# Patient Record
Sex: Male | Born: 1965 | Race: White | Hispanic: No | Smoking: Former smoker
Health system: Southern US, Community
[De-identification: ages and names within clinical notes are randomized; demographics above are authoritative.]

## PROBLEM LIST (undated history)

## (undated) DIAGNOSIS — I10 Essential (primary) hypertension: Secondary | ICD-10-CM

## (undated) DIAGNOSIS — E785 Hyperlipidemia, unspecified: Secondary | ICD-10-CM

## (undated) DIAGNOSIS — G4733 Obstructive sleep apnea (adult) (pediatric): Secondary | ICD-10-CM

## (undated) HISTORY — DX: Essential (primary) hypertension: I10

## (undated) HISTORY — DX: Obstructive sleep apnea (adult) (pediatric): G47.33

## (undated) HISTORY — DX: Hyperlipidemia, unspecified: E78.5

---

## 2007-08-03 HISTORY — PX: CARDIAC CATHETERIZATION: SHX172

## 2012-11-22 ENCOUNTER — Other Ambulatory Visit (HOSPITAL_COMMUNITY): Payer: Self-pay | Admitting: Cardiovascular Disease

## 2012-11-22 DIAGNOSIS — R079 Chest pain, unspecified: Secondary | ICD-10-CM

## 2012-11-22 DIAGNOSIS — R011 Cardiac murmur, unspecified: Secondary | ICD-10-CM

## 2012-11-28 ENCOUNTER — Ambulatory Visit (HOSPITAL_COMMUNITY)
Admission: RE | Admit: 2012-11-28 | Discharge: 2012-11-28 | Disposition: A | Discharge status: Home / Self Care | Payer: BC Managed Care – PPO | Source: Ambulatory Visit | Attending: Cardiovascular Disease | Admitting: Cardiovascular Disease

## 2012-11-28 ENCOUNTER — Other Ambulatory Visit (HOSPITAL_COMMUNITY): Payer: Self-pay | Admitting: Cardiovascular Disease

## 2012-11-28 DIAGNOSIS — R0989 Other specified symptoms and signs involving the circulatory and respiratory systems: Secondary | ICD-10-CM | POA: Insufficient documentation

## 2012-11-28 DIAGNOSIS — R011 Cardiac murmur, unspecified: Secondary | ICD-10-CM

## 2012-11-28 DIAGNOSIS — I10 Essential (primary) hypertension: Secondary | ICD-10-CM | POA: Insufficient documentation

## 2012-11-28 DIAGNOSIS — R079 Chest pain, unspecified: Secondary | ICD-10-CM | POA: Insufficient documentation

## 2012-11-28 DIAGNOSIS — R42 Dizziness and giddiness: Secondary | ICD-10-CM | POA: Insufficient documentation

## 2012-11-28 DIAGNOSIS — I519 Heart disease, unspecified: Secondary | ICD-10-CM

## 2012-11-28 DIAGNOSIS — R0609 Other forms of dyspnea: Secondary | ICD-10-CM | POA: Insufficient documentation

## 2012-11-28 MED ORDER — TECHNETIUM TC 99M SESTAMIBI GENERIC - CARDIOLITE
10.2000 | Freq: Once | INTRAVENOUS | Status: AC | PRN
  Administered 2012-11-28: 10 via INTRAVENOUS

## 2012-11-28 MED ORDER — TECHNETIUM TC 99M SESTAMIBI GENERIC - CARDIOLITE
30.1000 | Freq: Once | INTRAVENOUS | Status: AC | PRN
  Administered 2012-11-28: 30.1 via INTRAVENOUS

## 2012-11-28 NOTE — Procedures (Addendum)
[facility] [address] [place] [address] [phone]  Cardiology Nuclear Med Study  Dennis Bridges is a 47 y.o. male     MRN : [id number]     DOB: [birth year]  Procedure Date: 11/28/2012  Nuclear Med Background Indication for Stress Test:  Evaluation for Ischemia History:  IRREGULAR HEART RATE Cardiac Risk Factors: Family History - CAD, History of Smoking, Hypertension, Lipids and Obesity  Symptoms:  Chest Pain, Dizziness, DOE and Light-Headedness   Nuclear Pre-Procedure Caffeine/Decaff Intake:  5:00am NPO After: 7:00pm   IV Site: R Hand  IV 0.9% NS with Angio Cath:  22g  Chest Size (in):  52"  IV Started by: Emmit Pomfret, RN  Height: 5' 10.5" (1.791 m)  Cup Size: n/a  BMI:  Body mass index is 41.15 kg/(m^2). Weight:  291 lb (131.997 kg)   Tech Comments:  N/A    Nuclear Med Study 1 or 2 day study: 1 day  Stress Test Type:  Stress  Order Authorizing Provider:  Susa Griffins, MD   Resting Radionuclide: Technetium 23m Sestamibi  Resting Radionuclide Dose: 10.2 mCi   Stress Radionuclide:  Technetium 30m Sestamibi  Stress Radionuclide Dose: 30.1 mCi           Stress Protocol Rest HR: 69 Stress HR: 166  Rest BP: 150/71 Stress BP: 209/91  Exercise Time (min): 6:30 METS: 7.7   Predicted Max HR: 172 bpm % Max HR: 96.51 bpm Rate Pressure Product: 84132  Dose of Adenosine (mg):  n/a Dose of Lexiscan: n/a mg  Dose of Atropine (mg): n/a Dose of Dobutamine: n/a mcg/kg/min (at max HR)  Stress Test Technologist: Esperanza Sheets, CCT Nuclear Technologist: Gonzella Lex, CNMT   Rest Procedure:  Myocardial perfusion imaging was performed at rest 45 minutes following the intravenous administration of Technetium 62m Sestamibi. Stress Procedure:  The patient performed treadmill exercise using a Bruce  Protocol for 6:30 minutes. The patient stopped due to SOB and achieved target heart rate and denied any chest pain.  There  were no significant ST-T wave changes.  Technetium 75m Sestamibi was injected at peak exercise and myocardial perfusion imaging was performed after a brief delay.  Transient Ischemic Dilatation (Normal <1.22):  0.97 Lung/Heart Ratio (Normal <0.45):  0.32 QGS EDV:  95 ml QGS ESV:  32 ml LV Ejection Fraction: 63%  Signed by    Rest ECG: NSR - Normal EKG  Stress ECG: No significant change from baseline ECG  QPS Raw Data Images:  Normal; no motion artifact; normal heart/lung ratio. Stress Images:  Very mild diaphragmatic attenuation; otherwise,normal homogeneous uptake in all areas of the myocardium. Rest Images:  Normal homogeneous uptake in all areas of the myocardium. Subtraction (SDS):  Normal  Impression Exercise Capacity:  Good exercise capacity. BP Response:  Hypertensive blood pressure response. Clinical Symptoms:  No significant symptoms noted. ECG Impression:  No significant ST segment change suggestive of ischemia. Comparison with Prior Nuclear Study: No previous nuclear study performed  Overall Impression:  Low risk stress nuclear study demonstrating minimal diaphragmatic attenuation and hypertensive response to exercise.  LV Wall Motion:  NL LV Function; NL Wall Motion   Lennette Bihari, MD  11/28/2012 2:47 PM

## 2012-12-04 ENCOUNTER — Ambulatory Visit (HOSPITAL_COMMUNITY): Payer: BC Managed Care – PPO

## 2012-12-05 ENCOUNTER — Ambulatory Visit (HOSPITAL_COMMUNITY)
Admission: RE | Admit: 2012-12-05 | Discharge: 2012-12-05 | Disposition: A | Discharge status: Home / Self Care | Payer: BC Managed Care – PPO | Source: Ambulatory Visit | Attending: Cardiovascular Disease | Admitting: Cardiovascular Disease

## 2012-12-05 DIAGNOSIS — I10 Essential (primary) hypertension: Secondary | ICD-10-CM | POA: Insufficient documentation

## 2012-12-05 DIAGNOSIS — R011 Cardiac murmur, unspecified: Secondary | ICD-10-CM

## 2012-12-05 DIAGNOSIS — I519 Heart disease, unspecified: Secondary | ICD-10-CM

## 2012-12-05 DIAGNOSIS — F17200 Nicotine dependence, unspecified, uncomplicated: Secondary | ICD-10-CM | POA: Insufficient documentation

## 2012-12-05 NOTE — Progress Notes (Signed)
2D Echo Performed 12/05/2012    Hensley Treat, RCS  

## 2013-03-19 ENCOUNTER — Other Ambulatory Visit: Payer: Self-pay | Admitting: Cardiovascular Disease

## 2013-03-20 LAB — COMPREHENSIVE METABOLIC PANEL
AST: 19 U/L (ref 0–37)
Alkaline Phosphatase: 41 U/L (ref 39–117)
BUN: 17 mg/dL (ref 6–23)
Calcium: 10.4 mg/dL (ref 8.4–10.5)
Chloride: 101 mEq/L (ref 96–112)
Creat: 1.19 mg/dL (ref 0.50–1.35)
Total Bilirubin: 0.6 mg/dL (ref 0.3–1.2)

## 2013-03-20 LAB — CBC WITH DIFFERENTIAL/PLATELET
Basophils Absolute: 0.1 10*3/uL (ref 0.0–0.1)
Basophils Relative: 1 % (ref 0–1)
Eosinophils Absolute: 0.3 10*3/uL (ref 0.0–0.7)
Eosinophils Relative: 3 % (ref 0–5)
HCT: 43.4 % (ref 39.0–52.0)
MCH: 28.1 pg (ref 26.0–34.0)
MCHC: 33.2 g/dL (ref 30.0–36.0)
MCV: 84.8 fL (ref 78.0–100.0)
Monocytes Absolute: 0.4 10*3/uL (ref 0.1–1.0)
Platelets: 318 10*3/uL (ref 150–400)
RDW: 14.4 % (ref 11.5–15.5)

## 2013-03-28 ENCOUNTER — Encounter: Payer: Self-pay | Admitting: Cardiovascular Disease

## 2013-03-29 NOTE — ED Provider Notes (Signed)
Order(s) created erroneously. Erroneous order ID: [id number] Order canceled by: Archie Endo B Order cancel date/time: 03/29/2013 11:47 AM

## 2013-07-17 ENCOUNTER — Ambulatory Visit: Payer: BC Managed Care – PPO | Admitting: Cardiovascular Disease

## 2013-07-20 ENCOUNTER — Telehealth: Payer: Self-pay | Admitting: Cardiovascular Disease

## 2013-07-20 ENCOUNTER — Telehealth: Payer: Self-pay | Admitting: *Deleted

## 2013-07-20 NOTE — Telephone Encounter (Signed)
Mr.Mauzy is a former Dr.Weintraub patient and has not been assigned a new provider as of yet. But he needs a letter faxed to him from his cardiologist stating that it is ok for him to drive . He has [place] license and the state is requesting the letter he needs to have this letter within 15 days so that he may continue to work .. .. Please faxed to [phone].Marland Kitchen Please call if you have any questions..    Thanks

## 2013-07-20 NOTE — Telephone Encounter (Signed)
This pt needs an appointment for evaluation before MD can write a letter with this type of statement.  Please schedule first available (30 min) appt with a new cardiologist.  Thanks. ~ [facility]

## 2013-07-20 NOTE — Telephone Encounter (Signed)
Spoke with patient. Dennis Bridges states he need cardiac clearance for DOT -physician at [facility]. He states he has the letter that explains what is needed.   RN informed patient that an appointment can be made with an EXTENDER before 08/09/13.An possible get the ball rolling on his situation Spoke with Corine Shelter PA- and patient will be schedule to see him next week.  Patient is aware to bring medication , letter, and insurance card.

## 2013-07-24 ENCOUNTER — Ambulatory Visit (INDEPENDENT_AMBULATORY_CARE_PROVIDER_SITE_OTHER): Payer: BC Managed Care – PPO | Admitting: Cardiology

## 2013-07-24 ENCOUNTER — Encounter: Payer: Self-pay | Admitting: Cardiology

## 2013-07-24 VITALS — BP 142/88 | HR 62 | Ht 70.0 in | Wt 229.0 lb

## 2013-07-24 DIAGNOSIS — E669 Obesity, unspecified: Secondary | ICD-10-CM | POA: Insufficient documentation

## 2013-07-24 DIAGNOSIS — I10 Essential (primary) hypertension: Secondary | ICD-10-CM

## 2013-07-24 DIAGNOSIS — G4730 Sleep apnea, unspecified: Secondary | ICD-10-CM

## 2013-07-24 NOTE — Progress Notes (Signed)
07/24/2013 Dennis Bridges   July 26, 1966  [id number]  Primary Physicia Dennis Bridges., MD Primary Cardiologist: Dr Tresa Endo (former [facility] pt)  HPI:  Pleasant 47 y/o followed in the pst by Dr Alanda Amass with HTN, sleep apnea, and obesity. He had a positive sleep study and has been compliant with nightly C-pap. He has a negative Myoview and a normal echo in May 2014. He has manged to loose 70 lbs since last August with diet and exercise. He says he does 45 minute on a treadmill daily. He has no history of CAD, MI, chest pain, arrythmia, syncope, or palpitations. He is here for cardiac clearance for DOT.   Current Outpatient Prescriptions  Medication Sig Dispense Refill  . amLODipine (NORVASC) 5 MG tablet Take 5 mg by mouth daily.      Marland Kitchen aspirin 81 MG tablet Take 81 mg by mouth daily.      Marland Kitchen buPROPion (WELLBUTRIN SR) 150 MG 12 hr tablet Take 150 mg by mouth daily.      . fenofibrate (TRICOR) 145 MG tablet Take 145 mg by mouth daily.      . nebivolol (BYSTOLIC) 5 MG tablet Take 5 mg by mouth daily. qod      . Omega-3 Fatty Acids (FISH OIL) 1200 MG CPDR Take 1,200 Units by mouth 2 (two) times daily.      . pantoprazole (PROTONIX) 40 MG tablet Take 40 mg by mouth 2 (two) times daily.       No current facility-administered medications for this visit.    No Known Allergies  History   Social History  . Marital Status: Married    Spouse Name: N/A    Number of Children: N/A  . Years of Education: N/A   Occupational History  . Not on file.   Social History Main Topics  . Smoking status: Not on file  . Smokeless tobacco: Not on file  . Alcohol Use: Not on file  . Drug Use: Not on file  . Sexual Activity: Not on file   Other Topics Concern  . Not on file   Social History Narrative  . No narrative on file     Review of Systems: General: negative for chills, fever, night sweats or weight changes.  Cardiovascular: negative for chest pain, dyspnea on exertion, edema, orthopnea, palpitations,  paroxysmal nocturnal dyspnea or shortness of breath Dermatological: negative for rash Respiratory: negative for cough or wheezing Urologic: negative for hematuria Abdominal: negative for nausea, vomiting, diarrhea, bright red blood per rectum, melena, or hematemesis Neurologic: negative for visual changes, syncope, or dizziness All other systems reviewed and are otherwise negative except as noted above.    Blood pressure 142/88, pulse 62, height 5\' 10"  (1.778 m), weight 229 lb (103.874 kg).  General appearance: alert, cooperative and no distress Neck: no carotid bruit and no JVD Lungs: clear to auscultation bilaterally Heart: regular rate and rhythm Extremities: no edema  EKG NSR without acute changes  ASSESSMENT AND PLAN:   HTN (hypertension) Controlled  Sleep apnea- complaint with C-pap .   PLAN  No cardiac restrictions, cleared from our standpoint for DOT PE. He asked about another sleep study since he has lost weight. I think that would be reasonable. His goal weight would be 190.   Dennis Bridges KPA-C 07/24/2013 2:13 PM

## 2013-07-24 NOTE — Assessment & Plan Note (Signed)
Controlled.  

## 2013-07-25 ENCOUNTER — Encounter: Payer: Self-pay | Admitting: Cardiology

## 2013-08-29 ENCOUNTER — Other Ambulatory Visit: Payer: Self-pay | Admitting: *Deleted

## 2013-08-29 MED ORDER — NEBIVOLOL HCL 5 MG PO TABS: 5.0000 mg | ORAL_TABLET | Freq: Every day | ORAL | Status: DC

## 2014-02-07 ENCOUNTER — Telehealth: Payer: Self-pay | Admitting: *Deleted

## 2014-02-07 NOTE — Telephone Encounter (Signed)
[facility]

## 2014-05-01 ENCOUNTER — Ambulatory Visit: Payer: BC Managed Care – PPO | Admitting: Cardiovascular Disease

## 2014-05-06 ENCOUNTER — Other Ambulatory Visit (HOSPITAL_COMMUNITY): Payer: Self-pay | Admitting: Cardiovascular Disease

## 2014-05-06 DIAGNOSIS — I701 Atherosclerosis of renal artery: Secondary | ICD-10-CM

## 2014-05-08 ENCOUNTER — Ambulatory Visit (HOSPITAL_COMMUNITY)
Admission: RE | Admit: 2014-05-08 | Discharge: 2014-05-08 | Disposition: A | Discharge status: Home / Self Care | Payer: BC Managed Care – PPO | Source: Ambulatory Visit | Attending: Cardiovascular Disease | Admitting: Cardiovascular Disease

## 2014-05-08 ENCOUNTER — Ambulatory Visit (INDEPENDENT_AMBULATORY_CARE_PROVIDER_SITE_OTHER): Payer: BC Managed Care – PPO | Admitting: Cardiovascular Disease

## 2014-05-08 ENCOUNTER — Encounter: Payer: Self-pay | Admitting: Cardiovascular Disease

## 2014-05-08 VITALS — BP 144/78 | HR 59 | Ht 70.0 in | Wt 234.6 lb

## 2014-05-08 DIAGNOSIS — I701 Atherosclerosis of renal artery: Secondary | ICD-10-CM | POA: Insufficient documentation

## 2014-05-08 DIAGNOSIS — E669 Obesity, unspecified: Secondary | ICD-10-CM

## 2014-05-08 DIAGNOSIS — G4733 Obstructive sleep apnea (adult) (pediatric): Secondary | ICD-10-CM

## 2014-05-08 DIAGNOSIS — I10 Essential (primary) hypertension: Secondary | ICD-10-CM

## 2014-05-08 DIAGNOSIS — Z9989 Dependence on other enabling machines and devices: Secondary | ICD-10-CM

## 2014-05-08 MED ORDER — HYDRALAZINE HCL 25 MG PO TABS: ORAL_TABLET | ORAL | Status: DC

## 2014-05-08 NOTE — Progress Notes (Signed)
Renal Artery Duplex Completed. °Dennis Bridges,RVT °

## 2014-05-08 NOTE — Patient Instructions (Signed)
Your physician has recommended you make the following change in your medication: the hydralazine has been changed to 37.5 mg twice daily.  ( 25 mg = 1 & 1/2 tablet)  Your physician wants you to follow-up in: 6 months or sooner if needed. You will receive a reminder letter in the mail two months in advance. If you don't receive a letter, please call our office to schedule the follow-up appointment.

## 2014-05-08 NOTE — Progress Notes (Signed)
Patient ID: Dennis Amberichard Dinh, male   DOB: 04/13/1966, 48 y.o.   MRN: [id number]     HPI: Dennis AmberRichard Bridges is a 48 y.o. male who presents to the office today to establish cardiology care with me.  He is a former patient of Dr. Alanda AmassWeintraub and last saw Dr. Alanda AmassWeintraub in August 2014.  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  He was followed by Dr. Alanda AmassWeintraub for obesity, hyperlipidemia, and in the past, had a negative workup for pheochromocytoma.  An echo Doppler study in May 2014 showed an ejection fraction of 55-60%.  He had normal diastolic parameters.  His left atrium was mildly dilated.  A nuclear perfusion study in April 2014 revealed normal perfusion and function with post stress ejection fraction at 63%.  He saw Corine ShelterLuke Kilroy, [facility] in December 2014 4 cardiac clearance for his DOT physical..  He also has a history of obstructive sleep apnea and utilizes nightly CPAP.  He states that he has lost over 100 pounds and had reached a maximum weight of 314 and a minimum weight of approximately 215.  He does have a history of mild hypertension and has been taking hydralazine 50 mg once a day.  In the past he had been on additional medications, but these have been discontinued by his primary physician, Dr. Nadyne CoombesKaren Richter with his  significant weight loss.  He underwent a renal ultrasound today to further evaluate previously noted mild renal artery stenosis.   He denies chest pain.  He denies palpitations.  He presents to the office to establish care with me today.   Past Medical History  Diagnosis Date  . Hyperlipidemia   . Hypertension     History reviewed. No pertinent past surgical history.  No Known Allergies  Current Outpatient Prescriptions  Medication Sig Dispense Refill  . aspirin 81 MG tablet Take 81 mg by mouth daily.      . fenofibrate (TRICOR) 145 MG tablet Take 145 mg by mouth daily.      . Omega-3 Fatty Acids (FISH  OIL) 1200 MG CPDR Take 1,200 Units by mouth 2 (two) times daily.      . hydrALAZINE (APRESOLINE) 25 MG tablet Take 1 & 1/2 tablet twice a day  90 tablet  6   No current facility-administered medications for this visit.    History   Social History  . Marital Status: Married    Spouse Name: N/A    Number of Children: N/A  . Years of Education: N/A   Occupational History  . Not on file.   Social History Main Topics  . Smoking status: Former Smoker -- 1.00 packs/day for 20 years    Types: Cigarettes    Quit date: 07/24/2010  . Smokeless tobacco: Never Used  . Alcohol Use: Not on file  . Drug Use: Not on file  . Sexual Activity: Not on file   Other Topics Concern  . Not on file   Social History Narrative  . No narrative on file   Social history is notable in that he is originally from [place] and lived in to refill.  He is currently undergoing a divorce.  He has 2 children, ages 4619, and 7521.  There is a remote tobacco history.  Family History  Problem Relation Age of Onset  . Osteoporosis Mother   . Cancer - Lung Father   . Cancer - Other Father  brain  . Diabetes Father   . Hypertension Father     ROS General: Negative; No fevers, chills, or night sweats; positive for purposeful weight loss HEENT: Negative; No changes in vision or hearing, sinus congestion, difficulty swallowing Pulmonary: Negative; No cough, wheezing, shortness of breath, hemoptysis Cardiovascular: See HPI: No chest pain, presyncope, syncope, palpatations GI: Negative; No nausea, vomiting, diarrhea, or abdominal pain GU: Positive for mild renal artery narrowing on his last Doppler in October 2013 with velocities consistent with the 1-59% diameter reduction bilaterally No dysuria, hematuria, or difficulty voiding Musculoskeletal: Negative; no myalgias, joint pain, or weakness Hematologic: Negative; no easy bruising, bleeding Endocrine: Negative; no heat/cold intolerance; no diabetes, Neuro:  Negative; no changes in balance, headaches Skin: Negative; No rashes or skin lesions Psychiatric: Negative; No behavioral problems, depression Sleep: Positive for obstructive sleep apnea,  No daytime sleepiness, hypersomnolence, bruxism, restless legs, hypnogognic hallucinations. Other comprehensive 14 point system review is negative   Physical Exam BP 144/78  Pulse 59  Ht 5\' 10"  (1.778 m)  Wt 234 lb 9.6 oz (106.414 kg)  BMI 33.66 kg/m2 General: Alert, oriented, no distress.  Skin: normal turgor, no rashes, warm and dry HEENT: Normocephalic, atraumatic. Pupils equal round and reactive to light; sclera anicteric; extraocular muscles intact, No lid lag; Nose without nasal septal hypertrophy; Mouth/Parynx benign; Mallinpatti scale 3 Neck: No JVD, no carotid bruits; normal carotid upstroke Lungs: clear to ausculatation and percussion bilaterally; no wheezing or rales, normal inspiratory and expiratory effort Chest wall: without tenderness to palpitation Heart: PMI not displaced, RRR, s1 s2 normal, 1/6 systolic murmur, No diastolic murmur, no rubs, gallops, thrills, or heaves Abdomen: soft, nontender; no hepatosplenomehaly, BS+; abdominal aorta nontender and not dilated by palpation. Back: no CVA tenderness Pulses: 2+  Musculoskeletal: full range of motion, normal strength, no joint deformities Extremities: Pulses 2+, no clubbing cyanosis or edema, Homan's sign negative  Neurologic: grossly nonfocal; Cranial nerves grossly wnl Psychologic: Normal mood and affect   ECG (independently read by me): Sinus bradycardia 59 beats per minute.  No ectopy.  Normal intervals.  LABS:  BMET    Component Value Date/Time   NA 135 03/19/2013 1519   K 4.3 03/19/2013 1519   CL 101 03/19/2013 1519   CO2 27 03/19/2013 1519   GLUCOSE 93 03/19/2013 1519   BUN 17 03/19/2013 1519   CREATININE 1.19 03/19/2013 1519   CALCIUM 10.4 03/19/2013 1519     Hepatic Function Panel     Component Value Date/Time    PROT 7.6 03/19/2013 1519   ALBUMIN 4.9 03/19/2013 1519   AST 19 03/19/2013 1519   ALT 30 03/19/2013 1519   ALKPHOS 41 03/19/2013 1519   BILITOT 0.6 03/19/2013 1519     CBC    Component Value Date/Time   WBC 9.0 03/19/2013 1519   RBC 5.12 03/19/2013 1519   HGB 14.4 03/19/2013 1519   HCT 43.4 03/19/2013 1519   PLT 318 03/19/2013 1519   MCV 84.8 03/19/2013 1519   MCH 28.1 03/19/2013 1519   MCHC 33.2 03/19/2013 1519   RDW 14.4 03/19/2013 1519   LYMPHSABS 2.4 03/19/2013 1519   MONOABS 0.4 03/19/2013 1519   EOSABS 0.3 03/19/2013 1519   BASOSABS 0.1 03/19/2013 1519     BNP No results found for this basename: probnp    Lipid Panel  No results found for this basename: chol, trig, hdl, cholhdl, vldl, ldlcalc, ldldirect     RADIOLOGY: No results found.    ASSESSMENT AND PLAN: Mr.  Dennis Bridges is a 48 year old gentleman with a prior history of morbid obesity, who has lost approximately 80 pounds from his maximal weight.  He has a history of hypertension and has had many of his medications discontinued with his weight loss.  He does have a history of hyperlipidemia and has been on fenofibrate and fish oil.  He tells me he recently he had laboratory checked by his primary physician and his cholesterol was 131.  I will try to obtain these results.  Remotely, he had been on lisinopril and development issues secondary to this. His blood pressure today was mildly elevated at 144/78 and apparently has only been taking hydralazine 50 mg once a day.  I have recommended that we change his prescription from 50 mg pill to a 25 mg pill and suggested he take 37.5 mg twice a day for more optimal blood pressure control and greater likelihood for steady state blood levels.  I will review his renal Doppler study from today.  Once this has been interpreted to make certain he is not develop progressive renal artery stenosis.  His body mass index is still elevated at 33.66 and is consistent with mild obesity.  Further,  weight loss, and exercise was recommended.  He will monitor his blood pressure and contact me if this is not improved.  As long as he remains stable, I will see him in 6 months for cardiology reevaluation or sooner if problems arise.     Lennette Bihari, MD, [facility]  05/09/2014 5:30 PM

## 2014-05-09 ENCOUNTER — Telehealth: Payer: Self-pay | Admitting: *Deleted

## 2014-05-09 DIAGNOSIS — Z9989 Dependence on other enabling machines and devices: Secondary | ICD-10-CM

## 2014-05-09 DIAGNOSIS — G4733 Obstructive sleep apnea (adult) (pediatric): Secondary | ICD-10-CM | POA: Insufficient documentation

## 2014-05-09 DIAGNOSIS — I701 Atherosclerosis of renal artery: Secondary | ICD-10-CM | POA: Insufficient documentation

## 2014-05-09 NOTE — Telephone Encounter (Signed)
Left message renal ultrasound no significant change from previous study. Call if questions or concerns.

## 2014-05-09 NOTE — Telephone Encounter (Signed)
Message copied by Gaynelle CageWADDELL, Jahdai Padovano M. on Thu May 09, 2014  2:54 PM ------      Message from: Nicki GuadalajaraKELLY, THOMAS A      Created: Thu May 09, 2014  8:25 AM       Mild bilateral abnl; no sig change from previous ------

## 2014-05-13 ENCOUNTER — Encounter (HOSPITAL_COMMUNITY): Payer: BC Managed Care – PPO

## 2014-06-10 ENCOUNTER — Other Ambulatory Visit: Payer: Self-pay | Admitting: Cardiovascular Disease

## 2014-06-10 MED ORDER — FENOFIBRATE 145 MG PO TABS: 145.0000 mg | ORAL_TABLET | Freq: Every day | ORAL | Status: AC

## 2014-06-10 NOTE — Telephone Encounter (Signed)
Pt called in stating that he does not have any more refills of his Tricore and he was just in to see Dr. Tresa EndoKelly on 10/7. [facility] [address]  Thanks

## 2014-06-10 NOTE — Telephone Encounter (Signed)
Rx refill sent to patient pharmacy   

## 2014-09-05 ENCOUNTER — Telehealth: Payer: Self-pay | Admitting: Cardiovascular Disease

## 2014-09-05 NOTE — Telephone Encounter (Signed)
Close encounter 

## 2014-09-24 ENCOUNTER — Emergency Department (HOSPITAL_BASED_OUTPATIENT_CLINIC_OR_DEPARTMENT_OTHER)
Admission: EM | Admit: 2014-09-24 | Discharge: 2014-09-24 | Disposition: A | Discharge status: Home / Self Care | Payer: BLUE CROSS/BLUE SHIELD | Attending: Emergency Medicine | Admitting: Emergency Medicine

## 2014-09-24 ENCOUNTER — Encounter (HOSPITAL_BASED_OUTPATIENT_CLINIC_OR_DEPARTMENT_OTHER): Payer: Self-pay

## 2014-09-24 DIAGNOSIS — E785 Hyperlipidemia, unspecified: Secondary | ICD-10-CM | POA: Insufficient documentation

## 2014-09-24 DIAGNOSIS — G5611 Other lesions of median nerve, right upper limb: Secondary | ICD-10-CM

## 2014-09-24 DIAGNOSIS — Z7982 Long term (current) use of aspirin: Secondary | ICD-10-CM | POA: Insufficient documentation

## 2014-09-24 DIAGNOSIS — I10 Essential (primary) hypertension: Secondary | ICD-10-CM | POA: Diagnosis not present

## 2014-09-24 DIAGNOSIS — R200 Anesthesia of skin: Secondary | ICD-10-CM | POA: Diagnosis present

## 2014-09-24 DIAGNOSIS — Z87891 Personal history of nicotine dependence: Secondary | ICD-10-CM | POA: Diagnosis not present

## 2014-09-24 DIAGNOSIS — G5601 Carpal tunnel syndrome, right upper limb: Secondary | ICD-10-CM | POA: Diagnosis not present

## 2014-09-24 MED ORDER — NAPROXEN 500 MG PO TABS: 500.0000 mg | ORAL_TABLET | Freq: Two times a day (BID) | ORAL | Status: DC

## 2014-09-24 NOTE — Discharge Instructions (Signed)
Wear the splint, particularly at night. Recheck with Dr. Amanda PeaGramig if not improving within the next 1-2 weeks  Carpal Tunnel Syndrome The carpal tunnel is an area under the skin of the palm of your hand. Nerves, blood vessels, and strong tissues (tendons) pass through the tunnel. The tunnel can become puffy (swollen). If this happens, a nerve can be pinched in the wrist. This causes carpal tunnel syndrome.  HOME CARE  Take all medicine as told by your doctor.  If you were given a splint, wear it as told. Wear it at night or at times when your doctor told you to.  Rest your wrist from the activity that causes your pain.  Put ice on your wrist after long periods of wrist activity.  Put ice in a plastic bag.  Place a towel between your skin and the bag.  Leave the ice on for 15-20 minutes, 03-04 times a day.  Keep all doctor visits as told. GET HELP RIGHT AWAY IF:  You have new problems you cannot explain.  Your problems get worse and medicine does not help. MAKE SURE YOU:   Understand these instructions.  Will watch your condition.  Will get help right away if you are not doing well or get worse. Document Released: 07/08/2011 Document Revised: 10/11/2011 Document Reviewed: 07/08/2011 [facility] Patient Information 2015 [employer] [place]. This information is not intended to replace advice given to you by your health care provider. Make sure you discuss any questions you have with your health care provider.

## 2014-09-24 NOTE — ED Notes (Signed)
Numbness to right index and middle fingers x 1 week-denies actual injury-unloads trucks as job

## 2014-09-24 NOTE — ED Provider Notes (Signed)
CSN: [id number]     Arrival date & time 09/24/14  2132 History  This chart was scribed for Dennis Porter, MD by Chestine Spore, ED Scribe. The patient was seen in room MH12/MH12 at 10:15 PM.     Chief Complaint  Patient presents with  . Numbness    The history is provided by the patient. No language interpreter was used.    HPI Comments: Dennis Bridges is a 49 y.o. male with a medical hx of HTN and hyperlipidemia who presents to the Emergency Department complaining of constant numbness to the right index and middle fingers onset 1 week. The numbness is worse in the morning and then resolves throughout the day. Pt noticed the numbness when he woke up this morning. The numbness doesn't radiate up the arm.pt noted a bruise to the area. Pt picked up a couple cases of sausage which then fell on his hand today, the same instance occurred 1 week ago. Pt slammed the back of his hand against the wall when the incident occurred. Pt denies injury to the affected area. 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 He states that he is having associated symptoms of tingling.  He denies any other symptoms.   Past Medical History  Diagnosis Date  . Hyperlipidemia   . Hypertension    History reviewed. No pertinent past surgical history. Family History  Problem Relation Age of Onset  . Osteoporosis Mother   . Cancer - Lung Father   . Cancer - Other Father     brain  . Diabetes Father   . Hypertension Father    History  Substance Use Topics  . Smoking status: Former Smoker -- 1.00 packs/day for 20 years    Types: Cigarettes    Quit date: 07/24/2010  . Smokeless tobacco: Never Used  . Alcohol Use: No    Review of Systems  Constitutional: Negative for fever, chills, diaphoresis, appetite change and fatigue.  HENT: Negative for mouth sores, sore throat and trouble swallowing.   Eyes: Negative for visual disturbance.  Respiratory: Negative for cough, chest tightness, shortness of breath and wheezing.    Cardiovascular: Negative for chest pain.  Gastrointestinal: Negative for nausea, vomiting, abdominal pain, diarrhea and abdominal distention.  Endocrine: Negative for polydipsia, polyphagia and polyuria.  Genitourinary: Negative for dysuria, frequency and hematuria.  Musculoskeletal: Negative for gait problem.  Skin: Negative for color change, pallor and rash.  Neurological: Negative for dizziness, syncope, light-headedness and headaches.  Hematological: Does not bruise/bleed easily.  Psychiatric/Behavioral: Negative for behavioral problems and confusion.      Allergies  Review of patient's allergies indicates no known allergies.  Home Medications   Prior to Admission medications   Medication Sig Start Date End Date Taking? Authorizing Provider  aspirin 81 MG tablet Take 81 mg by mouth daily.    Historical Provider, MD  fenofibrate (TRICOR) 145 MG tablet Take 1 tablet (145 mg total) by mouth daily. 06/10/14   Lennette Bihari, MD  hydrALAZINE (APRESOLINE) 25 MG tablet Take 1 & 1/2 tablet twice a day 05/08/14   Lennette Bihari, MD  naproxen (NAPROSYN) 500 MG tablet Take 1 tablet (500 mg total) by mouth 2 (two) times daily. 09/24/14   Dennis Porter, MD  Omega-3 Fatty Acids (FISH OIL) 1200 MG CPDR Take 1,200 Units by mouth 2 (two) times daily.    Historical Provider, MD   BP 152/77 mmHg  Pulse 70  Temp(Src) 98.2 F (36.8 C) (Oral)  Resp 20  Ht 5\' 11"  (1.803 m)  Wt 237 lb (107.502 kg)  BMI 33.07 kg/m2  SpO2 98%  Physical Exam  Constitutional: He is oriented to person, place, and time. He appears well-developed and well-nourished. No distress.  HENT:  Head: Normocephalic.  Eyes: Conjunctivae are normal. Pupils are equal, round, and reactive to light. No scleral icterus.  Neck: Normal range of motion. Neck supple. No thyromegaly present.  Cardiovascular: Normal rate and regular rhythm.  Exam reveals no gallop and no friction rub.   No murmur heard. Pulmonary/Chest: Effort normal and  breath sounds normal. No respiratory distress. He has no wheezes. He has no rales.  Abdominal: Soft. Bowel sounds are normal. He exhibits no distension. There is no tenderness. There is no rebound.  Musculoskeletal: Normal range of motion.  Neurological: He is alert and oriented to person, place, and time.  Nl tinnels. Nl abduction and adduction. Numbness in thumb, index and middle finger, volar aspect. nl sensation dorsally. Nl cap refill. Nl pulses.   Skin: Skin is warm and dry. No rash noted.  Psychiatric: He has a normal mood and affect. His behavior is normal.  Nursing note and vitals reviewed.   ED Course  Procedures (including critical care time) DIAGNOSTIC STUDIES: Oxygen Saturation is 98% on RA, normal by my interpretation.    COORDINATION OF CARE: 10:21 PM-Discussed treatment plan which includes anti-inflammatory, splint, f/u with hand surgeon if the symptoms worsen with pt at bedside and pt agreed to plan.   Labs Review Labs Reviewed - No data to display  Imaging Review No results found.   EKG Interpretation None      MDM   Final diagnoses:  Median neuropathy, right  Carpal tunnel syndrome of right wrist   Patient with median neuropathy. May be traumatic. Maybe carpal tunnel/repetitive motion related. Sig with Velcro volar wrist splint. Anti-inflammatory. Hand surgical follow-up if not improving. Avoid repetitive motion of possible.  I personally performed the services described in this documentation, which was scribed in my presence. The recorded information has been reviewed and is accurate.    Dennis PorterMark Ireoluwa Grant, MD 09/24/14 [phone]

## 2014-10-24 ENCOUNTER — Encounter: Payer: Self-pay | Admitting: *Deleted

## 2014-11-03 ENCOUNTER — Emergency Department (HOSPITAL_BASED_OUTPATIENT_CLINIC_OR_DEPARTMENT_OTHER)
Admission: EM | Admit: 2014-11-03 | Discharge: 2014-11-04 | Disposition: A | Discharge status: Home / Self Care | Payer: BLUE CROSS/BLUE SHIELD | Attending: Emergency Medicine | Admitting: Emergency Medicine

## 2014-11-03 ENCOUNTER — Encounter (HOSPITAL_BASED_OUTPATIENT_CLINIC_OR_DEPARTMENT_OTHER): Payer: Self-pay

## 2014-11-03 ENCOUNTER — Emergency Department (HOSPITAL_BASED_OUTPATIENT_CLINIC_OR_DEPARTMENT_OTHER): Payer: BLUE CROSS/BLUE SHIELD

## 2014-11-03 DIAGNOSIS — Z8669 Personal history of other diseases of the nervous system and sense organs: Secondary | ICD-10-CM | POA: Insufficient documentation

## 2014-11-03 DIAGNOSIS — Z79899 Other long term (current) drug therapy: Secondary | ICD-10-CM | POA: Diagnosis not present

## 2014-11-03 DIAGNOSIS — Z8639 Personal history of other endocrine, nutritional and metabolic disease: Secondary | ICD-10-CM | POA: Diagnosis not present

## 2014-11-03 DIAGNOSIS — I10 Essential (primary) hypertension: Secondary | ICD-10-CM | POA: Insufficient documentation

## 2014-11-03 DIAGNOSIS — Z9889 Other specified postprocedural states: Secondary | ICD-10-CM | POA: Diagnosis not present

## 2014-11-03 DIAGNOSIS — Z87891 Personal history of nicotine dependence: Secondary | ICD-10-CM | POA: Insufficient documentation

## 2014-11-03 DIAGNOSIS — S5002XA Contusion of left elbow, initial encounter: Secondary | ICD-10-CM | POA: Diagnosis not present

## 2014-11-03 DIAGNOSIS — Z791 Long term (current) use of non-steroidal anti-inflammatories (NSAID): Secondary | ICD-10-CM | POA: Diagnosis not present

## 2014-11-03 DIAGNOSIS — Y998 Other external cause status: Secondary | ICD-10-CM | POA: Insufficient documentation

## 2014-11-03 DIAGNOSIS — S4992XA Unspecified injury of left shoulder and upper arm, initial encounter: Secondary | ICD-10-CM | POA: Insufficient documentation

## 2014-11-03 DIAGNOSIS — Y9289 Other specified places as the place of occurrence of the external cause: Secondary | ICD-10-CM | POA: Insufficient documentation

## 2014-11-03 DIAGNOSIS — Z7982 Long term (current) use of aspirin: Secondary | ICD-10-CM | POA: Insufficient documentation

## 2014-11-03 DIAGNOSIS — S8992XA Unspecified injury of left lower leg, initial encounter: Secondary | ICD-10-CM | POA: Insufficient documentation

## 2014-11-03 DIAGNOSIS — S3992XA Unspecified injury of lower back, initial encounter: Secondary | ICD-10-CM | POA: Insufficient documentation

## 2014-11-03 DIAGNOSIS — S6991XA Unspecified injury of right wrist, hand and finger(s), initial encounter: Secondary | ICD-10-CM | POA: Insufficient documentation

## 2014-11-03 DIAGNOSIS — W1830XA Fall on same level, unspecified, initial encounter: Secondary | ICD-10-CM | POA: Diagnosis not present

## 2014-11-03 DIAGNOSIS — Y9389 Activity, other specified: Secondary | ICD-10-CM | POA: Insufficient documentation

## 2014-11-03 DIAGNOSIS — S6992XA Unspecified injury of left wrist, hand and finger(s), initial encounter: Secondary | ICD-10-CM | POA: Insufficient documentation

## 2014-11-03 DIAGNOSIS — T07XXXA Unspecified multiple injuries, initial encounter: Secondary | ICD-10-CM

## 2014-11-03 DIAGNOSIS — S59902A Unspecified injury of left elbow, initial encounter: Secondary | ICD-10-CM | POA: Diagnosis present

## 2014-11-03 NOTE — ED Provider Notes (Signed)
CSN: [id number]     Arrival date & time 11/03/14  2114 History   This chart was scribed for Paula LibraJohn Reggie Welge, MD by Tonye RoyaltyJoshua Chen, ED Scribe. This patient was seen in room MH12/MH12 and the patient's care was started at 12:24 AM.    Chief Complaint  Patient presents with  . Fall   The history is provided by the patient. No language interpreter was used.    HPI Comments: Dennis Bridges is a 49 y.o. male who presents to the Emergency Department complaining of fall 2 days ago. 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 He injured his right hand, left elbow, left leg and bilateral hands, right greater than left. He reports numbness as well as limited ROM in right hand due to pain, particularly the middle finger. He also reports back pain just inferior to left scapula. Pain is moderate, worse with movement or palpation. He denies neck pain or head injury.  Past Medical History  Diagnosis Date  . Hyperlipidemia   . Hypertension   . OSA (obstructive sleep apnea)    Past Surgical History  Procedure Laterality Date  . [place]iac [place]  2009    [place] coronary arteries.   Family History  Problem Relation Age of Onset  . Osteoporosis Mother   . Cancer - Lung Father   . Cancer - Other Father     brain  . Diabetes Father   . Hypertension Father    History  Substance Use Topics  . Smoking status: Former Smoker -- 1.00 packs/day for 20 years    Types: Cigarettes    Quit date: 07/24/2010  . Smokeless tobacco: Never Used  . Alcohol Use: No    Review of Systems A complete 10 system review of systems was obtained and all systems are negative except as noted in the HPI and PMH.    Allergies  Review of patient's allergies indicates no known allergies.  Home Medications   Prior to Admission medications   Medication Sig Start Date End Date Taking? Authorizing Provider  aspirin 81 MG tablet Take  81 mg by mouth daily.    Historical Provider, MD  fenofibrate (TRICOR) 145 MG tablet Take 1 tablet (145 mg total) by mouth daily. 06/10/14   Lennette Biharihomas A Kelly, MD  hydrALAZINE (APRESOLINE) 25 MG tablet Take 1 & 1/2 tablet twice a day 05/08/14   Lennette Biharihomas A Kelly, MD  HYDROcodone-acetaminophen (NORCO/VICODIN) 5-325 MG per tablet Take 1-2 tablets by mouth every 6 (six) hours as needed (for pain). 11/04/14   Jaylissa Felty, MD  naproxen (NAPROSYN) 500 MG tablet Take 1 tablet (500 mg total) by mouth 2 (two) times daily. 09/24/14   Rolland PorterMark James, MD  Omega-3 Fatty Acids (FISH OIL) 1200 MG CPDR Take 1,200 Units by mouth 2 (two) times daily.    Historical Provider, MD   BP 128/75 mmHg  Pulse 57  Temp(Src) 97.9 F (36.6 C) (Oral)  Resp 20  Ht 5\' 10"  (1.778 m)  Wt 235 lb (106.595 kg)  BMI 33.72 kg/m2  SpO2 100%   Physical Exam  Nursing note and vitals reviewed. General: Well-developed, well-nourished male in no acute distress; appearance consistent with age of record HENT: normocephalic; atraumatic Eyes: pupils equal, round and reactive to light; extraocular muscles intact Neck: supple Back: no spinal tenderness; mild tenderness inferior to left scapula Heart: regular rate and rhythm;  Lungs: clear to auscultation  bilaterally Chest: Nontender Abdomen: soft; nondistended; nontender; bowel sounds present Extremities: pulses normal; abrasion and small tender hematoma just distal to the left olecranon; left hand hypothenar tenderness, no left snuffbox tenderness, full ROM of left hand, left hand neurovascularly intact; tenderness of the right hypothenar eminence, altered sensation of the right fingers distally with brisk capillary refill and limited ROM due to pain but no tendon function deficit; mild left patellar tenderness and swelling Neurologic: Awake, alert and oriented; motor function intact in all extremities and symmetric; no facial droop Skin: Warm and dry Psychiatric: Normal mood and affect   ED  Course  Procedures (including critical care time)  DIAGNOSTIC STUDIES: Oxygen Saturation is 100% on room air, normal by my interpretation.    COORDINATION OF CARE: 12:32 AM Discussed treatment plan with patient at beside, the patient agrees with the plan and has no further questions at this time.    MDM  Nursing notes and vitals signs, including pulse oximetry, reviewed.  Summary of this visit's results, reviewed by myself:  Imaging Studies: Dg Ribs Unilateral W/chest Left  11/03/2014   CLINICAL DATA:  Larey Seat backwards on refrigerated transfer truck 2 days ago, extremity pain and posterior rib pain. Assess fall.  EXAM: LEFT RIBS AND CHEST - 3+ VIEW  COMPARISON:  None.  FINDINGS: No fracture or other bone lesions are seen involving the ribs. There is no evidence of pneumothorax or pleural effusion. Both lungs are clear. Heart size and mediastinal contours are within normal limits.  IMPRESSION: Negative.   Electronically Signed   By: Awilda Metro   On: 11/03/2014 22:29   Dg Elbow Complete Left  11/03/2014   CLINICAL DATA:  Larey Seat backwards on refrigerated transfer truck 2 days ago, extremity pain. Assess fall.  EXAM: LEFT ELBOW - COMPLETE 3+ VIEW  COMPARISON:  None.  FINDINGS: There is no evidence of fracture, dislocation, or joint effusion. There is no evidence of arthropathy or other focal bone abnormality. Soft tissues are unremarkable.  IMPRESSION: Negative.   Electronically Signed   By: Awilda Metro   On: 11/03/2014 22:28   Dg Knee Complete 4 Views Left  11/03/2014   CLINICAL DATA:  Larey Seat backwards on refrigerated transfer truck 2 days ago, extremity pain. Assess fall.  EXAM: LEFT KNEE - COMPLETE 4+ VIEW  COMPARISON:  None.  FINDINGS: There is no evidence of fracture, dislocation, or joint effusion. There is no evidence of arthropathy or other focal bone abnormality. Soft tissues are unremarkable.  IMPRESSION: Negative.   Electronically Signed   By: Awilda Metro   On: 11/03/2014  22:28   Dg Hand Complete Left  11/03/2014   CLINICAL DATA:  Larey Seat backwards on refrigerated transfer truck 2 days ago, extremity pain. Assess fall.  EXAM: LEFT HAND - COMPLETE 3+ VIEW  COMPARISON:  None.  FINDINGS: There is no evidence of fracture or dislocation. There is no evidence of arthropathy or other focal bone abnormality. Soft tissues are unremarkable.  IMPRESSION: Negative.   Electronically Signed   By: Awilda Metro   On: 11/03/2014 22:27   Dg Hand Complete Right  11/03/2014   CLINICAL DATA:  Patient fell backwards on a truck 2 days ago. Bilateral hand pains, right worse than left.  EXAM: RIGHT HAND - COMPLETE 3+ VIEW  COMPARISON:  None.  FINDINGS: Mild degenerative changes at the STT joints and interphalangeal joints. No evidence of acute fracture or subluxation. No focal bone lesion or bone destruction. Bone cortex and trabecular architecture appear intact. No  radiopaque soft tissue foreign bodies.  IMPRESSION: Mild degenerative changes.  No acute bony abnormalities.   Electronically Signed   By: Burman Nieves M.D.   On: 11/03/2014 22:19   Advised that he may have a nerve injury in the right hand. If this is just due to swelling pressing on his nerves he may get relief in several days. If the nerve itself is damaged healing takes about 1 millimeter per day from the site of injury. He was advised that he may never regain complete normal sensation in that hand but there is no specific intervention at this time.    Final diagnoses:  Contusion of multiple sites   I personally performed the services described in this documentation, which was scribed in my presence. The recorded information has been reviewed and is accurate.   Paula Libra, MD 11/04/14 [phone]

## 2014-11-03 NOTE — ED Notes (Addendum)
Pt reports mechanical fall on slippery surface 2 days ago, not on thinners, reported pain to L elbow, L knee, R hand and L hand. L posterior rib pain, worse with deep inspiration.  Swelling to fingers bilateral, swelling L elbow.

## 2014-11-03 NOTE — ED Notes (Signed)
Patient transported to X-ray ambulatory with tech. 

## 2014-11-04 ENCOUNTER — Ambulatory Visit: Payer: Self-pay | Admitting: Cardiovascular Disease

## 2014-11-04 MED ORDER — HYDROCODONE-ACETAMINOPHEN 5-325 MG PO TABS: 1.0000 | ORAL_TABLET | Freq: Four times a day (QID) | ORAL | Status: DC | PRN

## 2014-11-04 MED ORDER — HYDROCODONE-ACETAMINOPHEN 5-325 MG PO TABS
1.0000 | ORAL_TABLET | Freq: Once | ORAL | Status: AC
  Administered 2014-11-04: 1 via ORAL
  Filled 2014-11-04: qty 1

## 2014-11-04 NOTE — Discharge Instructions (Signed)
Contusion °A contusion is a deep bruise. Contusions are the result of an injury that caused bleeding under the skin. The contusion may turn blue, purple, or yellow. Minor injuries will give you a painless contusion, but more severe contusions may stay painful and swollen for a few weeks.  °CAUSES  °A contusion is usually caused by a blow, trauma, or direct force to an area of the body. °SYMPTOMS  °· Swelling and redness of the injured area. °· Bruising of the injured area. °· Tenderness and soreness of the injured area. °· Pain. °DIAGNOSIS  °The diagnosis can be made by taking a history and physical exam. An X-ray, CT scan, or MRI may be needed to determine if there were any associated injuries, such as fractures. °TREATMENT  °Specific treatment will depend on what area of the body was injured. In general, the best treatment for a contusion is resting, icing, elevating, and applying cold compresses to the injured area. Over-the-counter medicines may also be recommended for pain control. Ask your caregiver what the best treatment is for your contusion. °HOME CARE INSTRUCTIONS  °· Put ice on the injured area. °¨ Put ice in a plastic bag. °¨ Place a towel between your skin and the bag. °¨ Leave the ice on for 15-20 minutes, 3-4 times a day, or as directed by your health care provider. °· Only take over-the-counter or prescription medicines for pain, discomfort, or fever as directed by your caregiver. Your caregiver may recommend avoiding anti-inflammatory medicines (aspirin, ibuprofen, and naproxen) for 48 hours because these medicines may increase bruising. °· Rest the injured area. °· If possible, elevate the injured area to reduce swelling. °SEEK IMMEDIATE MEDICAL CARE IF:  °· You have increased bruising or swelling. °· You have pain that is getting worse. °· Your swelling or pain is not relieved with medicines. °MAKE SURE YOU:  °· Understand these instructions. °· Will watch your condition. °· Will get help right  away if you are not doing well or get worse. °Document Released: 04/28/2005 Document Revised: 07/24/2013 Document Reviewed: 05/24/2011 °[employer]® Patient Information ©2015 [employer]. This information is not intended to replace advice given to you by your health care provider. Make sure you discuss any questions you have with your health care provider. ° °

## 2015-10-13 ENCOUNTER — Emergency Department (HOSPITAL_BASED_OUTPATIENT_CLINIC_OR_DEPARTMENT_OTHER)
Admission: EM | Admit: 2015-10-13 | Discharge: 2015-10-13 | Disposition: A | Discharge status: Home / Self Care | Payer: BLUE CROSS/BLUE SHIELD | Attending: Emergency Medicine | Admitting: Emergency Medicine

## 2015-10-13 ENCOUNTER — Emergency Department (HOSPITAL_BASED_OUTPATIENT_CLINIC_OR_DEPARTMENT_OTHER): Payer: BLUE CROSS/BLUE SHIELD

## 2015-10-13 ENCOUNTER — Encounter (HOSPITAL_BASED_OUTPATIENT_CLINIC_OR_DEPARTMENT_OTHER): Payer: Self-pay | Admitting: *Deleted

## 2015-10-13 DIAGNOSIS — Y9289 Other specified places as the place of occurrence of the external cause: Secondary | ICD-10-CM | POA: Insufficient documentation

## 2015-10-13 DIAGNOSIS — Z79899 Other long term (current) drug therapy: Secondary | ICD-10-CM | POA: Diagnosis not present

## 2015-10-13 DIAGNOSIS — Z7982 Long term (current) use of aspirin: Secondary | ICD-10-CM | POA: Diagnosis not present

## 2015-10-13 DIAGNOSIS — Z87891 Personal history of nicotine dependence: Secondary | ICD-10-CM | POA: Diagnosis not present

## 2015-10-13 DIAGNOSIS — Y998 Other external cause status: Secondary | ICD-10-CM | POA: Diagnosis not present

## 2015-10-13 DIAGNOSIS — E785 Hyperlipidemia, unspecified: Secondary | ICD-10-CM | POA: Diagnosis not present

## 2015-10-13 DIAGNOSIS — Z8669 Personal history of other diseases of the nervous system and sense organs: Secondary | ICD-10-CM | POA: Insufficient documentation

## 2015-10-13 DIAGNOSIS — Z9889 Other specified postprocedural states: Secondary | ICD-10-CM | POA: Diagnosis not present

## 2015-10-13 DIAGNOSIS — X500XXA Overexertion from strenuous movement or load, initial encounter: Secondary | ICD-10-CM | POA: Diagnosis not present

## 2015-10-13 DIAGNOSIS — S46311A Strain of muscle, fascia and tendon of triceps, right arm, initial encounter: Secondary | ICD-10-CM | POA: Insufficient documentation

## 2015-10-13 DIAGNOSIS — S4991XA Unspecified injury of right shoulder and upper arm, initial encounter: Secondary | ICD-10-CM | POA: Diagnosis present

## 2015-10-13 DIAGNOSIS — Y9389 Activity, other specified: Secondary | ICD-10-CM | POA: Diagnosis not present

## 2015-10-13 DIAGNOSIS — I10 Essential (primary) hypertension: Secondary | ICD-10-CM | POA: Diagnosis not present

## 2015-10-13 MED ORDER — IBUPROFEN 800 MG PO TABS: 800.0000 mg | ORAL_TABLET | Freq: Three times a day (TID) | ORAL | Status: DC | PRN

## 2015-10-13 MED ORDER — PREDNISONE 50 MG PO TABS: 50.0000 mg | ORAL_TABLET | Freq: Every day | ORAL | Status: DC

## 2015-10-13 MED ORDER — TRAMADOL HCL 50 MG PO TABS: 50.0000 mg | ORAL_TABLET | Freq: Four times a day (QID) | ORAL | Status: DC | PRN

## 2015-10-13 NOTE — Discharge Instructions (Signed)
Take the ibuprofen once she has completed the steroid.  Use ice and heat on the areas sore.  Follow-up with the, Dr. provided

## 2015-10-13 NOTE — ED Notes (Signed)
Pt unloads trucks for a living, states that he has had 2 weeks of arm pain from his elbow up.  Increased pain with movement.

## 2015-10-13 NOTE — ED Provider Notes (Signed)
CSN: [id number]     Arrival date & time 10/13/15  1900 History   First MD Initiated Contact with Patient 10/13/15 2134     Chief Complaint  Patient presents with  . Arm Injury     (Consider location/radiation/quality/duration/timing/severity/associated sxs/prior Treatment) HPI Patient presents to the emergency department with right upper arm pain started several weeks ago.  The patient states that he does heavy lifting and he does not recall any specific injury.  Patient states that movement and palpation make the pain worse.  Patient states that he noticed today the pain was worse than it had been.  Patient denies chest pain, shortness of breath, edema, weakness in his headache, blurred vision, back pain, neck pain, fever, rash, near syncope or syncope.  The patient states that he did not take any medications prior to arrival Past Medical History  Diagnosis Date  . Hyperlipidemia   . Hypertension   . OSA (obstructive sleep apnea)    Past Surgical History  Procedure Laterality Date  . Cardiac catheterization  2009    [place]normal coronary arteries.   Family History  Problem Relation Age of Onset  . Osteoporosis Mother   . Cancer - Lung Father   . Cancer - Other Father     brain  . Diabetes Father   . Hypertension Father    Social History  Substance Use Topics  . Smoking status: Former Smoker -- 1.00 packs/day for 20 years    Types: Cigarettes    Quit date: 07/24/2010  . Smokeless tobacco: Never Used  . Alcohol Use: No    Review of Systems  All other systems negative except as documented in the HPI. All pertinent positives and negatives as reviewed in the HPI.  Allergies  Review of patient's allergies indicates no known allergies.  Home Medications   Prior to Admission medications   Medication Sig Start Date End Date Taking? Authorizing Provider  aspirin 81 MG tablet Take 81 mg by mouth daily.    Historical Provider, MD  fenofibrate (TRICOR) 145 MG tablet  Take 1 tablet (145 mg total) by mouth daily. 06/10/14   Lennette Biharihomas A Kelly, MD  hydrALAZINE (APRESOLINE) 25 MG tablet Take 1 & 1/2 tablet twice a day 05/08/14   Lennette Biharihomas A Kelly, MD  Omega-3 Fatty Acids (FISH OIL) 1200 MG CPDR Take 1,200 Units by mouth 2 (two) times daily.    Historical Provider, MD   BP 152/76 mmHg  Pulse 75  Temp(Src) 98.1 F (36.7 C) (Oral)  Resp 18  Ht 5\' 10"  (1.778 m)  Wt 104.327 kg  BMI 33.00 kg/m2  SpO2 96% Physical Exam  Constitutional: He is oriented to person, place, and time. He appears well-developed and well-nourished. No distress.  HENT:  Head: Normocephalic and atraumatic.  Cardiovascular: Normal rate, regular rhythm and normal heart sounds.   Pulmonary/Chest: Effort normal and breath sounds normal. No respiratory distress.  Musculoskeletal:       Right upper arm: He exhibits tenderness. He exhibits no bony tenderness, no swelling, no edema and no deformity.       Arms: Neurological: He is alert and oriented to person, place, and time. He exhibits normal muscle tone. Coordination normal.  Skin: Skin is warm and dry. No rash noted. No erythema.    ED Course  Procedures (including critical care time) Labs Review Labs Reviewed - No data to display  Imaging Review Dg Humerus Right  10/13/2015  CLINICAL DATA:  Right humerus pain for 2 weeks.  Denies injury. Pain increases with movement. EXAM: RIGHT HUMERUS - 2+ VIEW COMPARISON:  None. FINDINGS: There is no evidence of fracture or other focal bone lesions. Soft tissues are unremarkable. IMPRESSION: Negative. Electronically Signed   By: Bary Branndon M.D.   On: 10/13/2015 19:49   I have personally reviewed and evaluated these images and lab results as part of my medical decision-making.  Patient was likely has a muscular strain based on the fact that he does a lot of heavy lifting at work and there is no direct injury to the area.  Patient agrees the plan and all questions were answered   Charlestine Night,  PA-C 10/13/15 2227  Rolan Bucco, MD 10/13/15 [phone]

## 2015-11-23 ENCOUNTER — Encounter (HOSPITAL_BASED_OUTPATIENT_CLINIC_OR_DEPARTMENT_OTHER): Payer: Self-pay | Admitting: *Deleted

## 2015-11-23 ENCOUNTER — Emergency Department (HOSPITAL_BASED_OUTPATIENT_CLINIC_OR_DEPARTMENT_OTHER)
Admission: EM | Admit: 2015-11-23 | Discharge: 2015-11-23 | Disposition: A | Discharge status: Home / Self Care | Payer: BLUE CROSS/BLUE SHIELD | Attending: Emergency Medicine | Admitting: Emergency Medicine

## 2015-11-23 DIAGNOSIS — E785 Hyperlipidemia, unspecified: Secondary | ICD-10-CM | POA: Diagnosis not present

## 2015-11-23 DIAGNOSIS — M546 Pain in thoracic spine: Secondary | ICD-10-CM | POA: Diagnosis not present

## 2015-11-23 DIAGNOSIS — Z79899 Other long term (current) drug therapy: Secondary | ICD-10-CM | POA: Diagnosis not present

## 2015-11-23 DIAGNOSIS — Z8669 Personal history of other diseases of the nervous system and sense organs: Secondary | ICD-10-CM | POA: Diagnosis not present

## 2015-11-23 DIAGNOSIS — Z9889 Other specified postprocedural states: Secondary | ICD-10-CM | POA: Diagnosis not present

## 2015-11-23 DIAGNOSIS — Z7982 Long term (current) use of aspirin: Secondary | ICD-10-CM | POA: Diagnosis not present

## 2015-11-23 DIAGNOSIS — Z87891 Personal history of nicotine dependence: Secondary | ICD-10-CM | POA: Diagnosis not present

## 2015-11-23 DIAGNOSIS — M25511 Pain in right shoulder: Secondary | ICD-10-CM

## 2015-11-23 DIAGNOSIS — I10 Essential (primary) hypertension: Secondary | ICD-10-CM | POA: Insufficient documentation

## 2015-11-23 NOTE — ED Notes (Signed)
Right shoulder pain since this evening.  Denies known injury.  Able to move arm.

## 2015-11-23 NOTE — ED Provider Notes (Signed)
CSN: [id number]     Arrival date & time 11/23/15  2108 History  By signing my name below, I, Dennis Bridges, attest that this documentation has been prepared under the direction and in the presence of [employer], PA-C. Electronically Signed: Budd Bridges, ED Scribe. 11/23/2015. 9:43 PM.    Chief Complaint  Patient presents with  . Shoulder Injury   The history is provided by the patient. No language interpreter was used.   HPI Comments: Dennis Bridges is a 50 y.o. male former smoker with a PMHx of HTN and HLD who presents to the Emergency Department complaining of an injury to the right shoulder sustained a few weeks ago. Pt states he does not recall any precise moment of injury. He reports associated constant, worsening pain radiating down from the joint to the tips of his fingers. He notes exacerbation of the pain with lying on the shoulder and with normal ROM, as well as alleviation with holding the arm still. Pt has tried taking 2 naproxen for this with mild relief. He notes he is right-handed. 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 He notes he has been doing this job for 25 years and has never been injured due to his work before. Pt denies neck pain and right elbow pain, as well as fevers, weight loss, and night sweats.   Past Medical History  Diagnosis Date  . Hyperlipidemia   . Hypertension   . OSA (obstructive sleep apnea)    Past Surgical History  Procedure Laterality Date  . Cardiac catheterization  2009    [place] -normal coronary arteries.   Family History  Problem Relation Age of Onset  . Osteoporosis Mother   . Cancer - Lung Father   . Cancer - Other Father     brain  . Diabetes Father   . Hypertension Father    Social History  Substance Use Topics  . Smoking status: Former Smoker -- 1.00 packs/day for 20 years    Types: Cigarettes    Quit date: 07/24/2010  . Smokeless tobacco: Never Used  .  Alcohol Use: No    Review of Systems  A complete 10 system review of systems was obtained and all systems are negative except as noted in the HPI and PMH.   Allergies  Review of patient's allergies indicates no known allergies.  Home Medications   Prior to Admission medications   Medication Sig Start Date End Date Taking? Authorizing Provider  aspirin 81 MG tablet Take 81 mg by mouth daily.    Historical Provider, MD  fenofibrate (TRICOR) 145 MG tablet Take 1 tablet (145 mg total) by mouth daily. 06/10/14   Lennette Bihari, MD  hydrALAZINE (APRESOLINE) 25 MG tablet Take 1 & 1/2 tablet twice a day 05/08/14   Lennette Bihari, MD  ibuprofen (ADVIL,MOTRIN) 800 MG tablet Take 1 tablet (800 mg total) by mouth every 8 (eight) hours as needed. 10/13/15   Charlestine Night, PA-C  Omega-3 Fatty Acids (FISH OIL) 1200 MG CPDR Take 1,200 Units by mouth 2 (two) times daily.    Historical Provider, MD   BP 159/77 mmHg  Pulse 71  Temp(Src) 98.5 F (36.9 C) (Oral)  Resp 18  Ht  (1.778 m)  Wt 111.131 kg  BMI 35.15 kg/m2  SpO2 100%   Physical Exam  Constitutional: He is oriented to person, place, and time. He appears well-developed and well-nourished.  HENT:  Head: Normocephalic and atraumatic.  Eyes: Conjunctivae and EOM are normal. Right eye exhibits no discharge. Left eye exhibits no discharge.  Neck: Normal range of motion.  Pulmonary/Chest: Effort normal. No respiratory distress.  Abdominal: He exhibits no distension.  Musculoskeletal: Normal range of motion. He exhibits tenderness.  TTP of right trapezius, both anterior and posterior TTP of the right shoulder. Pt has pain with flexion, extension, abduction, and adduction of the shoulder. Distal motor strength and sensation intact, 2+ radial pulse  Neurological: He is alert and oriented to person, place, and time. Coordination normal.  Skin: Skin is warm and dry. No rash noted. He is not diaphoretic. No erythema.  Psychiatric: He has a  normal mood and affect.  Nursing note and vitals reviewed.   ED Course  Procedures  DIAGNOSTIC STUDIES: Oxygen Saturation is 100% on RA, normal by my interpretation.    COORDINATION OF CARE: 9:41 PM - Discussed plans to refer to an orthopedist and to write a note for work. Advised to continue with naproxen. Will give pt shoulder exercises to perform. Pt advised of plan for treatment and pt agrees.  Labs Review Labs Reviewed - No data to display  Imaging Review No results found. I have personally reviewed and evaluated these images and lab results as part of my medical decision-making.   EKG Interpretation None      MDM   Final diagnoses:  Right shoulder pain   Labs:  Imaging:  Consults:  Therapeutics:  Discharge Meds:   Assessment/Plan: 50 year old male presents today with right shoulder pain. No acute injury noted, he has full sensation strength and motor function of the distal extremity. Patient does have complaints of radiation of pain down into his upper extremity. Uncertain etiology of patient's symptoms, likely overuse injury as patient lifts heavy objects and regular basis. Patient will be referred to orthopedics for follow-up, symptomatic care instructions given. Patient verbalized understanding and agreement today's plan had no further questions or concerns at time of discharge.     I personally performed the services described in this documentation, which was scribed in my presence. The recorded information has been reviewed and is accurate.   Eyvonne MechanicJeffrey Sahan Pen, PA-C 11/23/15 2214  Nelva Nayobert Beaton, MD 11/27/15 [phone]

## 2015-11-23 NOTE — ED Notes (Signed)
Care assumed at time of d/c. Orders received to d/c.

## 2015-11-23 NOTE — ED Notes (Signed)
[facility], see PA notes, pending orders.

## 2015-11-25 ENCOUNTER — Encounter: Payer: Self-pay | Admitting: Family Medicine

## 2015-11-25 ENCOUNTER — Encounter (INDEPENDENT_AMBULATORY_CARE_PROVIDER_SITE_OTHER): Payer: Self-pay

## 2015-11-25 ENCOUNTER — Ambulatory Visit (INDEPENDENT_AMBULATORY_CARE_PROVIDER_SITE_OTHER): Payer: BLUE CROSS/BLUE SHIELD | Admitting: Family Medicine

## 2015-11-25 VITALS — BP 128/76 | HR 65 | Ht 70.0 in | Wt 245.0 lb

## 2015-11-25 DIAGNOSIS — M25511 Pain in right shoulder: Secondary | ICD-10-CM

## 2015-11-25 MED ORDER — PREDNISONE 10 MG PO TABS: ORAL_TABLET | ORAL | Status: DC

## 2015-11-25 NOTE — Patient Instructions (Signed)
You have rotator cuff impingement with peripheral nerve irritation. Try to avoid painful activities (overhead activities, lifting with extended arm) as much as possible. Prednisone 6 day dose pack - take as directed until this is gone. Day AFTER finishing prednisone you could take aleve 2 tabs twice a day with food OR ibuprofen 600mg  three times a day with food for pain and inflammation. Can take tylenol in addition to this. Subacromial injection may be beneficial to help with pain and to decrease inflammation. Consider physical therapy with transition to home exercise program. Do home exercise program with theraband and scapular stabilization exercises daily - these are very important for long term relief even if an injection was given.  3 sets of 10 once a day. If not improving at follow-up we will consider further imaging, injection, physical therapy, and/or nitro patches. Follow up with me in 1 month but call me sooner if you're struggling.

## 2015-11-26 DIAGNOSIS — E785 Hyperlipidemia, unspecified: Secondary | ICD-10-CM | POA: Insufficient documentation

## 2015-11-26 DIAGNOSIS — M25511 Pain in right shoulder: Secondary | ICD-10-CM | POA: Insufficient documentation

## 2015-11-26 NOTE — Assessment & Plan Note (Signed)
consistent with rotator cuff impingement with peripheral nerve irritation.  Start with prednisone dose pack then switch to aleve.  Shown home exercise program to do daily.  Consider imaging, injection, PT, nitro patches if not improving.  F/u in 1 month.

## 2015-11-26 NOTE — Progress Notes (Signed)
PCP: Dois DavenportICHTER,KAREN L., MD  Subjective:   HPI: Patient is a 50 y.o. male here for right shoulder pain.  Patient reports he's had 4 weeks of worsening right shoulder pain. No known injury or trauma. Pain worse with motion of right shoulder - especially overhead, reaching. Pain level is 1/10 currently but up to 10/10 and sharp. Gets numbness in morning that goes away - goes into middle and ring fingers right hand. Tried icy hot, naproxen without much benefit. No skin changes.  Past Medical History  Diagnosis Date  . Hyperlipidemia   . Hypertension   . OSA (obstructive sleep apnea)     Current Outpatient Prescriptions on File Prior to Visit  Medication Sig Dispense Refill  . aspirin 81 MG tablet Take 81 mg by mouth daily.    . fenofibrate (TRICOR) 145 MG tablet Take 1 tablet (145 mg total) by mouth daily. 90 tablet 2  . hydrALAZINE (APRESOLINE) 25 MG tablet Take 1 & 1/2 tablet twice a day 90 tablet 6  . ibuprofen (ADVIL,MOTRIN) 800 MG tablet Take 1 tablet (800 mg total) by mouth every 8 (eight) hours as needed. 21 tablet 0  . Omega-3 Fatty Acids (FISH OIL) 1200 MG CPDR Take 1,200 Units by mouth 2 (two) times daily.     No current facility-administered medications on file prior to visit.    Past Surgical History  Procedure Laterality Date  . [place]iac [place]  2009    [place]normal coronary arteries.    No Known Allergies  Social History   Social History  . Marital Status: Divorced    Spouse Name: N/A  . Number of Children: N/A  . Years of Education: N/A   Occupational History  . Not on file.   Social History Main Topics  . Smoking status: Former Smoker -- 1.00 packs/day for 20 years    Types: Cigarettes    Quit date: 07/24/2010  . Smokeless tobacco: Never Used  . Alcohol Use: No  . Drug Use: No  . Sexual Activity: Not on file   Other Topics Concern  . Not on file   Social History Narrative    Family History  Problem Relation Age of Onset  .  Osteoporosis Mother   . Cancer - Lung Father   . Cancer - Other Father     brain  . Diabetes Father   . Hypertension Father     BP 128/76 mmHg  Pulse 65  Ht 5\' 10"  (1.778 m)  Wt 245 lb (111.131 kg)  BMI 35.15 kg/m2  Review of Systems: See HPI above.    Objective:  Physical Exam:  Gen: NAD, comfortable in exam room  Neck: No gross deformity, swelling, bruising. TTP minimally over lateral trapezius on right.  No midline/bony TTP. FROM neck without pain . BUE strength 5/5.   Sensation intact to light touch.   1+ equal reflexes in triceps, biceps, brachioradialis tendons. Negative spurlings. NV intact distal BUEs currently.  Right shoulder: No swelling, ecchymoses.  No gross deformity. No TTP. FROM with painful arc. Positive Hawkins, Neers. Negative Speeds, Yergasons. Strength 5/5 with empty can and resisted internal/external rotation.  Pain empty can. Negative apprehension. NV intact distally.    Assessment & Plan:  1. Right shoulder pain - consistent with rotator cuff impingement with peripheral nerve irritation.  Start with prednisone dose pack then switch to aleve.  Shown home exercise program to do daily.  Consider imaging, injection, PT, nitro patches if not improving.  F/u in 1  month.

## 2015-12-24 ENCOUNTER — Ambulatory Visit: Payer: BLUE CROSS/BLUE SHIELD | Admitting: Family Medicine

## 2016-11-10 ENCOUNTER — Emergency Department (HOSPITAL_COMMUNITY)
Admission: EM | Admit: 2016-11-10 | Discharge: 2016-11-10 | Disposition: A | Discharge status: Home / Self Care | Payer: BLUE CROSS/BLUE SHIELD | Attending: Emergency Medicine | Admitting: Emergency Medicine

## 2016-11-10 ENCOUNTER — Encounter (HOSPITAL_COMMUNITY): Payer: Self-pay | Admitting: *Deleted

## 2016-11-10 DIAGNOSIS — Z79899 Other long term (current) drug therapy: Secondary | ICD-10-CM | POA: Diagnosis not present

## 2016-11-10 DIAGNOSIS — Z7982 Long term (current) use of aspirin: Secondary | ICD-10-CM | POA: Insufficient documentation

## 2016-11-10 DIAGNOSIS — M62838 Other muscle spasm: Secondary | ICD-10-CM | POA: Diagnosis not present

## 2016-11-10 DIAGNOSIS — Y999 Unspecified external cause status: Secondary | ICD-10-CM | POA: Diagnosis not present

## 2016-11-10 DIAGNOSIS — S199XXA Unspecified injury of neck, initial encounter: Secondary | ICD-10-CM | POA: Insufficient documentation

## 2016-11-10 DIAGNOSIS — Y9389 Activity, other specified: Secondary | ICD-10-CM | POA: Insufficient documentation

## 2016-11-10 DIAGNOSIS — I10 Essential (primary) hypertension: Secondary | ICD-10-CM | POA: Insufficient documentation

## 2016-11-10 DIAGNOSIS — Z87891 Personal history of nicotine dependence: Secondary | ICD-10-CM | POA: Insufficient documentation

## 2016-11-10 DIAGNOSIS — Y92410 Unspecified street and highway as the place of occurrence of the external cause: Secondary | ICD-10-CM | POA: Insufficient documentation

## 2016-11-10 MED ORDER — IBUPROFEN 800 MG PO TABS: 800.0000 mg | ORAL_TABLET | Freq: Three times a day (TID) | ORAL | 0 refills | Status: AC | PRN

## 2016-11-10 MED ORDER — METHOCARBAMOL 500 MG PO TABS: 1000.0000 mg | ORAL_TABLET | Freq: Three times a day (TID) | ORAL | 0 refills | Status: AC | PRN
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
## 2019-09-12 ENCOUNTER — Emergency Department (HOSPITAL_BASED_OUTPATIENT_CLINIC_OR_DEPARTMENT_OTHER)
Admission: EM | Admit: 2019-09-12 | Discharge: 2019-09-12 | Disposition: A | Discharge status: Home / Self Care | Payer: BC Managed Care – PPO | Attending: Emergency Medicine | Admitting: Emergency Medicine

## 2019-09-12 ENCOUNTER — Encounter (HOSPITAL_BASED_OUTPATIENT_CLINIC_OR_DEPARTMENT_OTHER): Payer: Self-pay

## 2019-09-12 ENCOUNTER — Other Ambulatory Visit: Payer: Self-pay

## 2019-09-12 ENCOUNTER — Emergency Department (HOSPITAL_BASED_OUTPATIENT_CLINIC_OR_DEPARTMENT_OTHER): Payer: BC Managed Care – PPO

## 2019-09-12 DIAGNOSIS — Z87891 Personal history of nicotine dependence: Secondary | ICD-10-CM | POA: Insufficient documentation

## 2019-09-12 DIAGNOSIS — M25561 Pain in right knee: Secondary | ICD-10-CM | POA: Insufficient documentation

## 2019-09-12 DIAGNOSIS — Z7982 Long term (current) use of aspirin: Secondary | ICD-10-CM | POA: Diagnosis not present

## 2019-09-12 DIAGNOSIS — Z79899 Other long term (current) drug therapy: Secondary | ICD-10-CM | POA: Insufficient documentation

## 2019-09-12 DIAGNOSIS — I10 Essential (primary) hypertension: Secondary | ICD-10-CM | POA: Diagnosis not present

## 2019-09-12 MED ORDER — KETOROLAC TROMETHAMINE 60 MG/2ML IM SOLN
30.0000 mg | Freq: Once | INTRAMUSCULAR | Status: AC
  Administered 2019-09-12: 30 mg via INTRAMUSCULAR
  Filled 2019-09-12: qty 2

## 2019-09-12 NOTE — ED Provider Notes (Signed)
[facility] EMERGENCY DEPARTMENT Provider Note   CSN: [id number] Arrival date & time: 09/12/19  1910     History Chief Complaint  Patient presents with  . Knee Pain    Dennis Bridges is a 54 y.o. male with PMhx HTN, HLD, OSA who presents to the ED today complaining of sudden onset, constant, sharp, R knee pain that began earlier today.  Reports that he was walking outside to throw away the trash when his right knee buckled on him causing immediate pain.  He states he was able to catch himself and did not fall, no head injury.  States that he has been able to ambulate however states it is difficult due to pain.  He has not taken anything for pain prior to coming.  Patient does report that he injured this right knee in a similar fashion a couple years ago but never followed up with anyone.  Denies weakness, numbness, fevers, chills, any other associated symptoms.   The history is provided by the patient.       Past Medical History:  Diagnosis Date  . Hyperlipidemia   . Hypertension   . OSA (obstructive sleep apnea)     Patient Active Problem List   Diagnosis Date Noted  . HLD (hyperlipidemia) 11/26/2015  . Right shoulder pain 11/26/2015  . Renal artery stenosis (HCC) 05/09/2014  . Obstructive sleep apnea on CPAP 05/09/2014  . HTN (hypertension) 07/24/2013  . Obesity (BMI 30.0-34.9)- lost 70 lbs last 6 months 07/24/2013    History reviewed. No pertinent surgical history.     Family History  Problem Relation Age of Onset  . Osteoporosis Mother   . Cancer - Lung Father   . Cancer - Other Father        brain  . Diabetes Father   . Hypertension Father     Social History   Tobacco Use  . Smoking status: Former Smoker    Packs/day: 1.00    Years: 20.00    Pack years: 20.00    Types: Cigarettes    Quit date: 07/24/2010    Years since quitting: 9.1  . Smokeless tobacco: Never Used  Substance Use Topics  . Alcohol use: No    Alcohol/week: 0.0 standard  drinks  . Drug use: No    Home Medications Prior to Admission medications   Medication Sig Start Date End Date Taking? Authorizing Provider  aspirin 81 MG tablet Take 81 mg by mouth daily.    [provider]  fenofibrate (TRICOR) 145 MG tablet Take 1 tablet (145 mg total) by mouth daily. 06/10/14   Lennette Bihari, MD  hydrALAZINE (APRESOLINE) 25 MG tablet Take 1 & 1/2 tablet twice a day 05/08/14   Lennette Bihari, MD  ibuprofen (ADVIL,MOTRIN) 800 MG tablet Take 1 tablet (800 mg total) by mouth every 8 (eight) hours as needed for mild pain. 11/10/16   Ward, Layla Maw, DO  methocarbamol (ROBAXIN) 500 MG tablet Take 2 tablets (1,000 mg total) by mouth every 8 (eight) hours as needed for muscle spasms. 11/10/16   Ward, Layla Maw, DO  Omega-3 Fatty Acids (FISH OIL) 1200 MG CPDR Take 1,200 Units by mouth 2 (two) times daily.    [provider]  predniSONE (DELTASONE) 10 MG tablet 6 tabs po day 1, 5 tabs po day 2, 4 tabs po day 3, 3 tabs po day 4, 2 tabs po day 5, 1 tab po day 6 11/25/15   Hudnall, Azucena Fallen, MD  Allergies    Lisinopril  Review of Systems   Review of Systems  Constitutional: Negative for chills and fever.  Musculoskeletal: Positive for arthralgias.  Neurological: Negative for syncope, weakness and numbness.    Physical Exam Updated Vital Signs BP (!) 150/67 (BP Location: Right Arm)   Pulse 70   Temp 97.9 F (36.6 C) (Oral)   Resp 18   Ht 5\' 10"  (1.778 m)   Wt 126.1 kg   SpO2 99%   BMI 39.89 kg/m   Physical Exam Vitals and nursing note reviewed.  Constitutional:      Appearance: He is not ill-appearing.  HENT:     Head: Normocephalic and atraumatic.  Eyes:     Conjunctiva/sclera: Conjunctivae normal.  Cardiovascular:     Rate and Rhythm: Normal rate and regular rhythm.  Pulmonary:     Effort: Pulmonary effort is normal.     Breath sounds: Normal breath sounds.  Musculoskeletal:     Comments: No erythema or increased warmth to R knee. Mild  swelling noted to R knee compared to L. + TTP to medial aspect of knee. ROM intact however limited due to pain. Negative anterior and posterior drawer test. No varus or valgus laxity. 2+ DP pulse.   Skin:    General: Skin is warm and dry.     Coloration: Skin is not jaundiced.  Neurological:     Mental Status: He is alert.     ED Results / Procedures / Treatments   Labs (all labs ordered are listed, but only abnormal results are displayed) Labs Reviewed - No data to display  EKG None  Radiology DG Knee Complete 4 Views Right  Result Date: 09/12/2019 CLINICAL DATA:  Right knee pain.  Knee buckled. EXAM: RIGHT KNEE - COMPLETE 4+ VIEW COMPARISON:  None. FINDINGS: No evidence of fracture, dislocation, or joint effusion. No evidence of arthropathy or other focal bone abnormality. Soft tissues are unremarkable. IMPRESSION: Negative radiographs of the right knee. Electronically Signed   By: 11/10/2019 M.D.   On: 09/12/2019 20:20    Procedures Procedures (including critical care time)  Medications Ordered in ED Medications  ketorolac (TORADOL) injection 30 mg (30 mg Intramuscular Given 09/12/19 2053)    ED Course  I have reviewed the triage vital signs and the nursing notes.  Pertinent labs & imaging results that were available during my care of the patient were reviewed by me and considered in my medical decision making (see chart for details).  54 year old male who presents to the ED today complaining of sudden onset of right knee pain after he felt like his knee buckled on him.  Has been able to ambulate however states painful.  He has tenderness to palpation to the medial aspect.  No ligamentous injury.  An x-ray was obtained prior to being seen -get it.  There is concern for possible meniscal injury.  Will provide knee immobilizer, crutches, ice pack.  Toradol shot given.  Patient advised to take ibuprofen and Tylenol as needed for pain.  Advised to follow-up with Ortho.  Strict  return precautions have been discussed.  Patient is in agreement with plan is stable for discharge home.    MDM Rules/Calculators/A&P                       Final Clinical Impression(s) / ED Diagnoses Final diagnoses:  Acute pain of right knee    Rx / DC Orders ED Discharge Orders    None  Discharge Instructions     Please follow up with Dr. Raeford Razor for further evaluation of your knee While at home please rest, ice, and elevate your knee to reduce swelling Use crutches and wear knee immobilizer until you can be seen by Dr. Ronnald Ramp can take Ibuprofen and Tylenol as needed for pain        Eustaquio Maize, PA-C 09/12/19 2108    Tegeler, Gwenyth Allegra, MD 09/13/19 [phone]

## 2019-09-12 NOTE — Discharge Instructions (Signed)
Please follow up with Dr. Jordan Likes for further evaluation of your knee While at home please rest, ice, and elevate your knee to reduce swelling Use crutches and wear knee immobilizer until you can be seen by Dr. Talmage Nap can take Ibuprofen and Tylenol as needed for pain

## 2019-09-12 NOTE — ED Triage Notes (Signed)
Pt states his right knee "buckled on me" ~645pm-denies injury-NAD-to triage in w/c

## 2019-12-19 ENCOUNTER — Ambulatory Visit: Payer: BC Managed Care – PPO | Admitting: Cardiovascular Disease

## 2019-12-19 ENCOUNTER — Encounter: Payer: Self-pay | Admitting: Cardiovascular Disease

## 2019-12-19 ENCOUNTER — Other Ambulatory Visit: Payer: Self-pay

## 2019-12-19 VITALS — BP 126/70 | HR 72 | Ht 70.0 in | Wt 288.0 lb

## 2019-12-19 DIAGNOSIS — I701 Atherosclerosis of renal artery: Secondary | ICD-10-CM

## 2019-12-19 DIAGNOSIS — Z9989 Dependence on other enabling machines and devices: Secondary | ICD-10-CM

## 2019-12-19 DIAGNOSIS — I10 Essential (primary) hypertension: Secondary | ICD-10-CM

## 2019-12-19 DIAGNOSIS — E782 Mixed hyperlipidemia: Secondary | ICD-10-CM | POA: Diagnosis not present

## 2019-12-19 DIAGNOSIS — G4733 Obstructive sleep apnea (adult) (pediatric): Secondary | ICD-10-CM | POA: Diagnosis not present

## 2019-12-19 MED ORDER — HYDROCHLOROTHIAZIDE 12.5 MG PO CAPS: 12.5000 mg | ORAL_CAPSULE | Freq: Every day | ORAL | 3 refills | Status: DC

## 2019-12-19 NOTE — Progress Notes (Signed)
Cardiology Office Note    Date:  12/26/2019   ID:  Dennis Bridges, DOB 12/19/1965, MRN [id number]  PCP:  Katherina Mires, MD  Cardiologist:  Shelva Majestic, MD   Reestablishment of cardiology/sleep care  History of Present Illness:  Dennis Bridges is a 54 y.o. male who is a former patient of Dr. Rollene Fare.  I had seen him in October 2015 for evaluation.  He has a history of hyper tension, hyperlipidemia, obesity, and obstructive sleep apnea.  He presents to the office to reestablish care.  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  Remotely, he had had a negative work-up for pheochromocytoma.  In May 2014 an echo Doppler study showed an ejection fraction of 55 to 60% and he had normal diastolic parameters.  Left atrium was mildly dilated.  He had a normal perfusion study in April 2014 with post-rest ejection fraction at 63%.  He has a history of significant obesity and in the past had weighed as much as 314 pounds.  He subsequently lost approximately 100 pounds but over the past several years has regained weight and is now back up to 288 pounds.  He has been on CPAP therapy and has a new ResMed air sense 10 CPAP unit.  He works the night shift typically from 8:30 PM until 6:55 AM.  He typically goes to bed at 8 AM and oftentimes wakes up between 4 and 5 PM.  He uses CPAP.  His DME company is [employer].  Recently, he has noticed some blood pressure lability with blood pressures ranging from 1 20-1 50.  He also notes an occasional palpitation.  He is on Bystolic 5 mg daily and also hydralazine 37.5 mg twice a day for blood pressure he also is on fenofibrate for hyperlipidemia and fish oil.  In the office today I was able to obtain a new download once we linked his machine to our office and a download from November 19, 2019 through Dec 18, 2019 shows 100% compliance with average usage at 7 hours and 45 minutes.  He is set at a minimum pressure for  maximum pressure of 20 and AHI 0.3.  His 95th percentile pressure is 14.4 with a maximum average pressure at 16.5.  He uses so clean and distilled water.  He denies any chest pain presyncope or syncope.  He presents  to reestablish care.  Past Medical History:  Diagnosis Date  . Hyperlipidemia   . Hypertension   . OSA (obstructive sleep apnea)     History reviewed. No pertinent surgical history.  Current Medications: Outpatient Medications Prior to Visit  Medication Sig Dispense Refill  . aspirin 81 MG tablet Take 81 mg by mouth daily.    . fenofibrate (TRICOR) 145 MG tablet Take 1 tablet (145 mg total) by mouth daily. 90 tablet 2  . hydrALAZINE (APRESOLINE) 25 MG tablet Take 1 & 1/2 tablet twice a day 90 tablet 6  . ibuprofen (ADVIL,MOTRIN) 800 MG tablet Take 1 tablet (800 mg total) by mouth every 8 (eight) hours as needed for mild pain. 30 tablet 0  . methocarbamol (ROBAXIN) 500 MG tablet Take 2 tablets (1,000 mg total) by mouth every 8 (eight) hours as needed for muscle spasms. 20 tablet 0  . nebivolol (BYSTOLIC) 5 MG tablet Take 5 mg by mouth daily.    . Omega-3 Fatty Acids (FISH OIL) 1200 MG CPDR Take 1,200 Units by mouth 2 (two) times  daily.    . predniSONE (DELTASONE) 10 MG tablet 6 tabs po day 1, 5 tabs po day 2, 4 tabs po day 3, 3 tabs po day 4, 2 tabs po day 5, 1 tab po day 6 21 tablet 0   No facility-administered medications prior to visit.     Allergies:   Lisinopril   Social History   Socioeconomic History  . Marital status: Divorced    Spouse name: Not on file  . Number of children: Not on file  . Years of education: Not on file  . Highest education level: Not on file  Occupational History  . Not on file  Tobacco Use  . Smoking status: Former Smoker    Packs/day: 1.00    Years: 20.00    Pack years: 20.00    Types: Cigarettes    Quit date: 07/24/2010    Years since quitting: 9.4  . Smokeless tobacco: Never Used  Substance and Sexual Activity  . Alcohol  use: No    Alcohol/week: 0.0 standard drinks  . Drug use: No  . Sexual activity: Not on file  Other Topics Concern  . Not on file  Social History Narrative  . Not on file   Social Determinants of Health   Financial Resource Strain:   . Difficulty of Paying Living Expenses:   Food Insecurity:   . Worried About Charity fundraiser in the Last Year:   . Arboriculturist in the Last Year:   Transportation Needs:   . Film/video editor (Medical):   Marland Kitchen Lack of Transportation (Non-Medical):   Physical Activity:   . Days of Exercise per Week:   . Minutes of Exercise per Session:   Stress:   . Feeling of Stress :   Social Connections:   . Frequency of Communication with Friends and Family:   . Frequency of Social Gatherings with Friends and Family:   . Attends Religious Services:   . Active Member of Clubs or Organizations:   . Attends Archivist Meetings:   Marland Kitchen Marital Status:      Family History:  The patient's family history includes Cancer - Lung in his father; Cancer - Other in his father; Diabetes in his father; Hypertension in his father; Osteoporosis in his mother.   ROS General: Negative; No fevers, chills, or night sweats;  HEENT: Negative; No changes in vision or hearing, sinus congestion, difficulty swallowing Pulmonary: Negative; No cough, wheezing, shortness of breath, hemoptysis Cardiovascular: Negative; No chest pain, presyncope, syncope, palpitations GI: Negative; No nausea, vomiting, diarrhea, or abdominal pain GU: Negative; No dysuria, hematuria, or difficulty voiding Musculoskeletal: Negative; no myalgias, joint pain, or weakness Hematologic/Oncology: Negative; no easy bruising, bleeding Endocrine: Negative; no heat/cold intolerance; no diabetes Neuro: Negative; no changes in balance, headaches Skin: Negative; No rashes or skin lesions Psychiatric: Negative; No behavioral problems, depression Sleep: Positive for OSA on CPAP therapy.  No awareness  of breakthrough snoring,; daytime sleepiness, hypersomnolence, bruxism, restless legs, hypnogognic hallucinations, no cataplexy Other comprehensive 14 point system review is negative.   PHYSICAL EXAM:   VS:  BP 126/70 (BP Location: Left Arm, Patient Position: Sitting, Cuff Size: Large)   Pulse 72   Ht '5\' 10"'  (1.778 m)   Wt 288 lb (130.6 kg)   BMI 41.32 kg/m      Wt Readings from Last 3 Encounters:  12/19/19 288 lb (130.6 kg)  09/12/19 278 lb (126.1 kg)  11/10/16 248 lb (112.5 kg)  General: Alert, oriented, no distress.  Morbid obesity. Skin: normal turgor, no rashes, warm and dry HEENT: Normocephalic, atraumatic. Pupils equal round and reactive to light; sclera anicteric; extraocular muscles intact;  Nose without nasal septal hypertrophy Mouth/Parynx benign; Mallinpatti scale Neck: No JVD, no carotid bruits; normal carotid upstroke Lungs: clear to ausculatation and percussion; no wheezing or rales Chest wall: without tenderness to palpitation Heart: PMI not displaced, RRR, s1 s2 normal, 6-3/8 systolic murmur left sternal border, no diastolic murmur, no rubs, gallops, thrills, or heaves Abdomen: soft, nontender; no hepatosplenomehaly, BS+; abdominal aorta nontender and not dilated by palpation. Back: no CVA tenderness Pulses 2+ Musculoskeletal: full range of motion, normal strength, no joint deformities Extremities: no clubbing cyanosis or edema, Homan's sign negative  Neurologic: grossly nonfocal; Cranial nerves grossly wnl Psychologic: Normal mood and affect   Studies/Labs Reviewed:   EKG:  EKG is ordered today.  ECG (independently read by me): Normal sinus rhythm at 72 bpm.  Nonspecific ST changes.  Normal intervals.  No ectopy.  Recent Labs: BMP Latest Ref Rng & Units 12/20/2019 03/19/2013  Glucose 65 - 99 mg/dL 101(H) 93  BUN 6 - 24 mg/dL 18 17  Creatinine 0.76 - 1.27 mg/dL 0.98 1.19  BUN/Creat Ratio 9 - 20 18 -  Sodium 134 - 144 mmol/L 137 135  Potassium 3.5 - 5.2  mmol/L 3.8 4.3  Chloride 96 - 106 mmol/L 99 101  CO2 20 - 29 mmol/L 22 27  Calcium 8.7 - 10.2 mg/dL 9.6 10.4     Hepatic Function Latest Ref Rng & Units 12/20/2019 03/19/2013  Total Protein 6.0 - 8.5 g/dL 7.1 7.6  Albumin 3.8 - 4.9 g/dL 4.6 4.9  AST 0 - 40 IU/L 23 19  ALT 0 - 44 IU/L 35 30  Alk Phosphatase 48 - 121 IU/L 114 41  Total Bilirubin 0.0 - 1.2 mg/dL 0.5 0.6    CBC Latest Ref Rng & Units 12/20/2019 03/19/2013  WBC 3.4 - 10.8 x10E3/uL 9.2 9.0  Hemoglobin 13.0 - 17.7 g/dL 15.9 14.4  Hematocrit 37.5 - 51.0 % 48.7 43.4  Platelets 150 - 450 x10E3/uL 264 318   Lab Results  Component Value Date   MCV 85 12/20/2019   MCV 84.8 03/19/2013   Lab Results  Component Value Date   TSH 0.827 12/20/2019   No results found for: HGBA1C   BNP No results found for: BNP  ProBNP No results found for: PROBNP   Lipid Panel     Component Value Date/Time   CHOL 138 12/20/2019 0811   TRIG 137 12/20/2019 0811   HDL 41 12/20/2019 0811   CHOLHDL 3.4 12/20/2019 0811   LDLCALC 73 12/20/2019 0811   LABVLDL 24 12/20/2019 0811     RADIOLOGY: No results found.   Additional studies/ records that were reviewed today include:  I reviewed the patient's prior evaluation in 2015 including his prior nuclear perfusion study from April 2014 evaluations from Dr. Horald Pollen, and previous renal duplex study from October 2015.  A download was obtained from November 19, 2019 through Dec 18, 2019  ASSESSMENT:    1. Hypertension, unspecified type   2. Obstructive sleep apnea on CPAP   3. Morbid obesity ([place])   4. Moderate mixed hyperlipidemia not requiring statin therapy   5. Mild Renal artery stenosis [facility])    PLAN:  Campbell Riches 73-year-old gentleman is a history of hypertension, hyperlipidemia, morbid obesity, and in 2014 echo Doppler evaluation showed normal ejection fraction with EF 55 to  60% with normal diastolic parameters and mild left atrial dilatation.  A nuclear perfusion study  revealed normal myocardial perfusion and function.  He has a history of obstructive sleep apnea and has been using CPAP regularly.  He had obtained a new ResMed air sense 10 CPAP auto unit which apparently is set at a minimum pressure of 4 and maximum up to 20.  On his most recent download which I was able to obtain today after getting his new machine bring to our office, his 95th percentile pressure is 14.4 with a maximum average pressure of 16.5.  I am changing his parameters to a minimum of 8 up to a maximum of 20.  He does not have any significant mask leak.  He has experienced blood pressure lability and at times has noticed some mild leg swelling.  I am adding HCTZ 12.5 mg to his medical regimen.  He currently is on Bystolic 5 mg and hydralazine 37.5 mg twice a day.  A renal duplex study in 2015 showed mildly elevated renal velocities consistent with a 1 to 59% diameter reduction.  Kidney size was normal.  At times he does note some rare premature beats.  I am scheduling him for a 7-year follow-up echo Doppler study to further reevaluate his systolic and diastolic function as well as cardiac murmur.  He is using CPAP with excellent compliance and averaging 7 hours and 45 minutes of sleep per night.  In the past he had lost approximately 100 pounds but unfortunately has gained a fair amount of the weight back.  We discussed the importance of weight loss improve diet and exercise.  Typically discussed the cardiac benefit of the Mediterranean diet.  I recommend a complete set of fasting laboratory be obtained.  Adjustments to his medications will be made depending upon laboratory results.  In 3 months for follow-up evaluation.   Medication Adjustments/Labs and Tests Ordered: Current medicines are reviewed at length with the patient today.  Concerns regarding medicines are outlined above.  Medication changes, Labs and Tests ordered today are listed in the Patient Instructions below. Patient Instructions    Medication Instructions:  BEGIN TAKING HCTZ 12.5MG DAILY  *If you need a refill on your cardiac medications before your next appointment, please call your pharmacy*  LABS: FASTING LABS: CMET CBC TSH LIPID   Testing/Procedures: Your physician has requested that you have an echocardiogram. Echocardiography is a painless test that uses sound waves to create images of your heart. It provides your doctor with information about the size and shape of your heart and how well your heart's chambers and valves are working. This procedure takes approximately one hour. There are no restrictions for this procedure.  [place]   Follow-Up: At [facility], you and your health needs are our priority.  As part of our continuing mission to provide you with exceptional heart care, we have created designated Provider Care Teams.  These Care Teams include your primary Cardiologist (physician) and Advanced Practice Providers (APPs -  Physician Assistants and Nurse Practitioners) who all work together to provide you with the care you need, when you need it.  We recommend signing up for the patient portal called "MyChart".  Sign up information is provided on this After Visit Summary.  MyChart is used to connect with patients for Virtual Visits (Telemedicine).  Patients are able to view lab/test results, encounter notes, upcoming appointments, etc.  Non-urgent messages can be sent to your provider as well.   To  learn more about what you can do with MyChart, go to [email or web address].    Your next appointment:   3 month(s)  The format for your next appointment:   In Person  Provider:   Shelva Majestic, MD   Other Instructions CHANGES TO YOUR CPAP MACHINE MADE Bruce refers to food and lifestyle choices that are based on the traditions of countries located on the [employer]. This way of eating has been shown to help prevent  certain conditions and improve outcomes for people who have chronic diseases, like kidney disease and heart disease. What are tips for following this plan? Lifestyle  Cook and eat meals together with your family, when possible.  Drink enough fluid to keep your urine clear or pale yellow.  Be physically active every day. This includes: ? Aerobic exercise like running or swimming. ? Leisure activities like gardening, walking, or housework.  Get 7-8 hours of sleep each night.  If recommended by your health care provider, drink red wine in moderation. This means 1 glass a day for nonpregnant women and 2 glasses a day for men. A glass of wine equals 5 oz (150 mL). Reading food labels   Check the serving size of packaged foods. For foods such as rice and pasta, the serving size refers to the amount of cooked product, not dry.  Check the total fat in packaged foods. Avoid foods that have saturated fat or trans fats.  Check the ingredients list for added sugars, such as corn syrup. Shopping  At the grocery store, buy most of your food from the areas near the walls of the store. This includes: ? Fresh fruits and vegetables (produce). ? Grains, beans, nuts, and seeds. Some of these may be available in unpackaged forms or large amounts (in bulk). ? Fresh seafood. ? Poultry and eggs. ? Low-fat dairy products.  Buy whole ingredients instead of prepackaged foods.  Buy fresh fruits and vegetables in-season from local farmers markets.  Buy frozen fruits and vegetables in resealable bags.  If you do not have access to quality fresh seafood, buy precooked frozen shrimp or canned fish, such as tuna, salmon, or sardines.  Buy small amounts of raw or cooked vegetables, salads, or olives from the deli or salad bar at your store.  Stock your pantry so you always have certain foods on hand, such as olive oil, canned tuna, canned tomatoes, rice, pasta, and beans. Cooking  Cook foods with  extra-virgin olive oil instead of using butter or other vegetable oils.  Have meat as a side dish, and have vegetables or grains as your main dish. This means having meat in small portions or adding small amounts of meat to foods like pasta or stew.  Use beans or vegetables instead of meat in common dishes like chili or lasagna.  Experiment with different cooking methods. Try roasting or broiling vegetables instead of steaming or sauteing them.  Add frozen vegetables to soups, stews, pasta, or rice.  Add nuts or seeds for added healthy fat at each meal. You can add these to yogurt, salads, or vegetable dishes.  Marinate fish or vegetables using olive oil, lemon juice, garlic, and fresh herbs. Meal planning   Plan to eat 1 vegetarian meal one day each week. Try to work up to 2 vegetarian meals, if possible.  Eat seafood 2 or more times a week.  Have healthy snacks readily available, such as: ? Vegetable sticks with hummus. ?  Greek yogurt. ? Fruit and nut trail mix.  Eat balanced meals throughout the week. This includes: ? Fruit: 2-3 servings a day ? Vegetables: 4-5 servings a day ? Low-fat dairy: 2 servings a day ? Fish, poultry, or lean meat: 1 serving a day ? Beans and legumes: 2 or more servings a week ? Nuts and seeds: 1-2 servings a day ? Whole grains: 6-8 servings a day ? Extra-virgin olive oil: 3-4 servings a day  Limit red meat and sweets to only a few servings a month What are my food choices?  Mediterranean diet ? Recommended  Grains: Whole-grain pasta. Brown rice. Bulgar wheat. Polenta. Couscous. Whole-wheat bread. [place].  Vegetables: Artichokes. Beets. Broccoli. Cabbage. Carrots. Eggplant. Green beans. Chard. Kale. Spinach. Onions. Leeks. Peas. Squash. Tomatoes. Peppers. Radishes.  Fruits: Apples. Apricots. Avocado. Berries. Bananas. Cherries. Dates. Figs. Grapes. Lemons. Melon. Oranges. Peaches. Plums. Pomegranate.  Meats and other protein foods:  Beans. Almonds. Sunflower seeds. Pine nuts. Peanuts. [place]. Salmon. Scallops. Shrimp. [place]. Tilapia. Clams. Oysters. Eggs.  Dairy: Low-fat milk. Cheese. Greek yogurt.  Beverages: Water. Red wine. Herbal tea.  Fats and oils: Extra virgin olive oil. Avocado oil. Grape seed oil.  Sweets and desserts: [place] yogurt with honey. Baked apples. Poached pears. Trail mix.  Seasoning and other foods: Basil. Cilantro. Coriander. Cumin. Mint. Parsley. Sage. Rosemary. Tarragon. Garlic. Oregano. Thyme. Pepper. Balsalmic vinegar. Tahini. Hummus. Tomato sauce. Olives. Mushrooms. ? Limit these  Grains: Prepackaged pasta or rice dishes. Prepackaged cereal with added sugar.  Vegetables: Deep fried potatoes (french fries).  Fruits: Fruit canned in syrup.  Meats and other protein foods: Beef. Pork. Lamb. Poultry with skin. Hot dogs. Berniece Salines.  Dairy: Ice cream. Sour cream. Whole milk.  Beverages: Juice. Sugar-sweetened soft drinks. Beer. Liquor and spirits.  Fats and oils: Butter. Canola oil. Vegetable oil. Beef fat (tallow). Lard.  Sweets and desserts: Cookies. Cakes. Pies. Candy.  Seasoning and other foods: Mayonnaise. Premade sauces and marinades. The items listed may not be a complete list. Talk with your dietitian about what dietary choices are right for you. Summary  The Mediterranean diet includes both food and lifestyle choices.  Eat a variety of fresh fruits and vegetables, beans, nuts, seeds, and whole grains.  Limit the amount of red meat and sweets that you eat.  Talk with your health care provider about whether it is safe for you to drink red wine in moderation. This means 1 glass a day for nonpregnant women and 2 glasses a day for men. A glass of wine equals 5 oz (150 mL). This information is not intended to replace advice given to you by your health care provider. Make sure you discuss any questions you have with your health care provider. Document Revised: 03/18/2016 Document Reviewed:  03/11/2016 Elsevier Patient Education  2020 Welsh, Shelva Majestic, MD  12/26/2019 7:04 PM    [facility] [address] [place], [place], [place]  [address] Phone: [phone]

## 2019-12-19 NOTE — Patient Instructions (Signed)
Medication Instructions:  BEGIN TAKING HCTZ 12.5MG  DAILY  *If you need a refill on your cardiac medications before your next appointment, please call your pharmacy*  LABS: FASTING LABS: CMET CBC TSH LIPID   Testing/Procedures: Your physician has requested that you have an echocardiogram. Echocardiography is a painless test that uses sound waves to create images of your heart. It provides your doctor with information about the size and shape of your heart and how well your heart's chambers and valves are working. This procedure takes approximately one hour. There are no restrictions for this procedure.  [address]   Follow-Up: At [facility], you and your health needs are our priority.  As part of our continuing mission to provide you with exceptional heart care, we have created designated Provider Care Teams.  These Care Teams include your primary Cardiologist (physician) and Advanced Practice Providers (APPs -  Physician Assistants and Nurse Practitioners) who all work together to provide you with the care you need, when you need it.  We recommend signing up for the patient portal called "MyChart".  Sign up information is provided on this After Visit Summary.  MyChart is used to connect with patients for Virtual Visits (Telemedicine).  Patients are able to view lab/test results, encounter notes, upcoming appointments, etc.  Non-urgent messages can be sent to your provider as well.   To learn more about what you can do with MyChart, go to [email or web address].    Your next appointment:   3 month(s)  The format for your next appointment:   In Person  Provider:   Nicki Guadalajara, MD   Other Instructions CHANGES TO YOUR CPAP MACHINE MADE IN CLINIC TODAY    Mediterranean Diet A Mediterranean diet refers to food and lifestyle choices that are based on the traditions of countries located on the [employer]. This way of eating has been shown to help prevent  certain conditions and improve outcomes for people who have chronic diseases, like kidney disease and heart disease. What are tips for following this plan? Lifestyle  Cook and eat meals together with your family, when possible.  Drink enough fluid to keep your urine clear or pale yellow.  Be physically active every day. This includes: ? Aerobic exercise like running or swimming. ? Leisure activities like gardening, walking, or housework.  Get 7-8 hours of sleep each night.  If recommended by your health care provider, drink red wine in moderation. This means 1 glass a day for nonpregnant women and 2 glasses a day for men. A glass of wine equals 5 oz (150 mL). Reading food labels   Check the serving size of packaged foods. For foods such as rice and pasta, the serving size refers to the amount of cooked product, not dry.  Check the total fat in packaged foods. Avoid foods that have saturated fat or trans fats.  Check the ingredients list for added sugars, such as corn syrup. Shopping  At the grocery store, buy most of your food from the areas near the walls of the store. This includes: ? Fresh fruits and vegetables (produce). ? Grains, beans, nuts, and seeds. Some of these may be available in unpackaged forms or large amounts (in bulk). ? Fresh seafood. ? Poultry and eggs. ? Low-fat dairy products.  Buy whole ingredients instead of prepackaged foods.  Buy fresh fruits and vegetables in-season from local farmers markets.  Buy frozen fruits and vegetables in resealable bags.  If you do not have access to quality  fresh seafood, buy precooked frozen shrimp or canned fish, such as tuna, salmon, or sardines.  Buy small amounts of raw or cooked vegetables, salads, or olives from the deli or salad bar at your store.  Stock your pantry so you always have certain foods on hand, such as olive oil, canned tuna, canned tomatoes, rice, pasta, and beans. Cooking  Cook foods with  extra-virgin olive oil instead of using butter or other vegetable oils.  Have meat as a side dish, and have vegetables or grains as your main dish. This means having meat in small portions or adding small amounts of meat to foods like pasta or stew.  Use beans or vegetables instead of meat in common dishes like chili or lasagna.  Experiment with different cooking methods. Try roasting or broiling vegetables instead of steaming or sauteing them.  Add frozen vegetables to soups, stews, pasta, or rice.  Add nuts or seeds for added healthy fat at each meal. You can add these to yogurt, salads, or vegetable dishes.  Marinate fish or vegetables using olive oil, lemon juice, garlic, and fresh herbs. Meal planning   Plan to eat 1 vegetarian meal one day each week. Try to work up to 2 vegetarian meals, if possible.  Eat seafood 2 or more times a week.  Have healthy snacks readily available, such as: ? Vegetable sticks with hummus. ? [place] yogurt. ? Fruit and nut trail mix.  Eat balanced meals throughout the week. This includes: ? Fruit: 2-3 servings a day ? Vegetables: 4-5 servings a day ? Low-fat dairy: 2 servings a day ? Fish, poultry, or lean meat: 1 serving a day ? Beans and legumes: 2 or more servings a week ? Nuts and seeds: 1-2 servings a day ? Whole grains: 6-8 servings a day ? Extra-virgin olive oil: 3-4 servings a day  Limit red meat and sweets to only a few servings a month What are my food choices?  Mediterranean diet ? Recommended  Grains: Whole-grain pasta. Brown rice. Bulgar wheat. Polenta. Couscous. Whole-wheat bread. [place].  Vegetables: Artichokes. Beets. Broccoli. Cabbage. Carrots. Eggplant. Green beans. Chard. Kale. Spinach. Onions. Leeks. Peas. Squash. Tomatoes. Peppers. Radishes.  Fruits: Apples. Apricots. Avocado. Berries. Bananas. Cherries. Dates. Figs. Grapes. Lemons. Melon. Oranges. Peaches. Plums. Pomegranate.  Meats and other protein foods:  Beans. Almonds. Sunflower seeds. Pine nuts. Peanuts. [place]. Salmon. Scallops. Shrimp. [place]. Tilapia. Clams. Oysters. Eggs.  Dairy: Low-fat milk. Cheese. Greek yogurt.  Beverages: Water. Red wine. Herbal tea.  Fats and oils: Extra virgin olive oil. Avocado oil. Grape seed oil.  Sweets and desserts: [place] yogurt with honey. Baked apples. Poached pears. Trail mix.  Seasoning and other foods: Basil. Cilantro. Coriander. Cumin. Mint. Parsley. Sage. Rosemary. Tarragon. Garlic. Oregano. Thyme. Pepper. Balsalmic vinegar. Tahini. Hummus. Tomato sauce. Olives. Mushrooms. ? Limit these  Grains: Prepackaged pasta or rice dishes. Prepackaged cereal with added sugar.  Vegetables: Deep fried potatoes (french fries).  Fruits: Fruit canned in syrup.  Meats and other protein foods: Beef. Pork. Lamb. Poultry with skin. Hot dogs. Berniece Salines.  Dairy: Ice cream. Sour cream. Whole milk.  Beverages: Juice. Sugar-sweetened soft drinks. Beer. Liquor and spirits.  Fats and oils: Butter. Canola oil. Vegetable oil. Beef fat (tallow). Lard.  Sweets and desserts: Cookies. Cakes. Pies. Candy.  Seasoning and other foods: Mayonnaise. Premade sauces and marinades. The items listed may not be a complete list. Talk with your dietitian about what dietary choices are right for you. Summary  The Mediterranean diet includes both  food and lifestyle choices.  Eat a variety of fresh fruits and vegetables, beans, nuts, seeds, and whole grains.  Limit the amount of red meat and sweets that you eat.  Talk with your health care provider about whether it is safe for you to drink red wine in moderation. This means 1 glass a day for nonpregnant women and 2 glasses a day for men. A glass of wine equals 5 oz (150 mL). This information is not intended to replace advice given to you by your health care provider. Make sure you discuss any questions you have with your health care provider. Document Revised: 03/18/2016 Document Reviewed:  03/11/2016 Elsevier Patient Education  [place].

## 2019-12-20 LAB — COMPREHENSIVE METABOLIC PANEL
ALT: 35 IU/L (ref 0–44)
AST: 23 IU/L (ref 0–40)
Albumin/Globulin Ratio: 1.8 (ref 1.2–2.2)
Albumin: 4.6 g/dL (ref 3.8–4.9)
Alkaline Phosphatase: 114 IU/L (ref 48–121)
BUN/Creatinine Ratio: 18 (ref 9–20)
BUN: 18 mg/dL (ref 6–24)
Bilirubin Total: 0.5 mg/dL (ref 0.0–1.2)
CO2: 22 mmol/L (ref 20–29)
Calcium: 9.6 mg/dL (ref 8.7–10.2)
Chloride: 99 mmol/L (ref 96–106)
Creatinine, Ser: 0.98 mg/dL (ref 0.76–1.27)
GFR calc Af Amer: 99 mL/min/{1.73_m2} (ref >59)
GFR calc non Af Amer: 86 mL/min/{1.73_m2} (ref >59)
Globulin, Total: 2.5 g/dL (ref 1.5–4.5)
Glucose: 101 mg/dL — ABNORMAL HIGH (ref 65–99)
Potassium: 3.8 mmol/L (ref 3.5–5.2)
Sodium: 137 mmol/L (ref 134–144)
Total Protein: 7.1 g/dL (ref 6.0–8.5)

## 2019-12-20 LAB — LIPID PANEL
Chol/HDL Ratio: 3.4 ratio (ref 0.0–5.0)
Cholesterol, Total: 138 mg/dL (ref 100–199)
HDL: 41 mg/dL (ref >39)
LDL Chol Calc (NIH): 73 mg/dL (ref 0–99)
Triglycerides: 137 mg/dL (ref 0–149)
VLDL Cholesterol Cal: 24 mg/dL (ref 5–40)

## 2019-12-20 LAB — CBC
Hematocrit: 48.7 % (ref 37.5–51.0)
Hemoglobin: 15.9 g/dL (ref 13.0–17.7)
MCH: 27.9 pg (ref 26.6–33.0)
MCHC: 32.6 g/dL (ref 31.5–35.7)
MCV: 85 fL (ref 79–97)
Platelets: 264 10*3/uL (ref 150–450)
RBC: 5.7 x10E6/uL (ref 4.14–5.80)
RDW: 13.1 % (ref 11.6–15.4)
WBC: 9.2 10*3/uL (ref 3.4–10.8)

## 2019-12-20 LAB — TSH: TSH: 0.827 u[IU]/mL (ref 0.450–4.500)

## 2019-12-26 ENCOUNTER — Encounter: Payer: Self-pay | Admitting: Cardiovascular Disease

## 2020-01-14 ENCOUNTER — Telehealth: Payer: Self-pay | Admitting: Cardiovascular Disease

## 2020-01-14 ENCOUNTER — Other Ambulatory Visit: Payer: Self-pay

## 2020-01-14 ENCOUNTER — Ambulatory Visit (HOSPITAL_COMMUNITY): Payer: BC Managed Care – PPO | Attending: Cardiology

## 2020-01-14 DIAGNOSIS — I10 Essential (primary) hypertension: Secondary | ICD-10-CM | POA: Diagnosis present

## 2020-01-14 DIAGNOSIS — Z9989 Dependence on other enabling machines and devices: Secondary | ICD-10-CM

## 2020-01-14 DIAGNOSIS — E782 Mixed hyperlipidemia: Secondary | ICD-10-CM

## 2020-01-14 DIAGNOSIS — G4733 Obstructive sleep apnea (adult) (pediatric): Secondary | ICD-10-CM

## 2020-01-14 NOTE — Telephone Encounter (Signed)
  Pt is returning call for his echo result  

## 2020-01-15 NOTE — Telephone Encounter (Signed)
Reviewed echo and lab results with pt. Verbalized understanding with no other questions at this time.

## 2020-01-15 NOTE — Telephone Encounter (Signed)
Patient returning call in regards to echo results.  

## 2020-03-31 ENCOUNTER — Other Ambulatory Visit: Payer: Self-pay

## 2020-03-31 ENCOUNTER — Ambulatory Visit (INDEPENDENT_AMBULATORY_CARE_PROVIDER_SITE_OTHER): Payer: BC Managed Care – PPO | Admitting: Cardiovascular Disease

## 2020-03-31 ENCOUNTER — Encounter: Payer: Self-pay | Admitting: Cardiovascular Disease

## 2020-03-31 DIAGNOSIS — Z9989 Dependence on other enabling machines and devices: Secondary | ICD-10-CM

## 2020-03-31 DIAGNOSIS — G4733 Obstructive sleep apnea (adult) (pediatric): Secondary | ICD-10-CM | POA: Diagnosis not present

## 2020-03-31 DIAGNOSIS — I10 Essential (primary) hypertension: Secondary | ICD-10-CM

## 2020-03-31 DIAGNOSIS — E782 Mixed hyperlipidemia: Secondary | ICD-10-CM | POA: Diagnosis not present

## 2020-03-31 MED ORDER — HYDRALAZINE HCL 50 MG PO TABS: 50.0000 mg | ORAL_TABLET | Freq: Two times a day (BID) | ORAL | 3 refills | Status: AC

## 2020-03-31 NOTE — Patient Instructions (Addendum)
Medication Instructions:  INCREASE YOUR HYDRALAZINE TO 50MG  TWO TIMES DAILY *If you need a refill on your cardiac medications before your next appointment, please call your pharmacy*     Follow-Up: At [facility], you and your health needs are our priority.  As part of our continuing mission to provide you with exceptional heart care, we have created designated Provider Care Teams.  These Care Teams include your primary Cardiologist (physician) and Advanced Practice Providers (APPs -  Physician Assistants and Nurse Practitioners) who all work together to provide you with the care you need, when you need it.  We recommend signing up for the patient portal called "MyChart".  Sign up information is provided on this After Visit Summary.  MyChart is used to connect with patients for Virtual Visits (Telemedicine).  Patients are able to view lab/test results, encounter notes, upcoming appointments, etc.  Non-urgent messages can be sent to your provider as well.   To learn more about what you can do with MyChart, go to [facility].    Your next appointment:   6 month(s)  The format for your next appointment:   In Person  Provider:   [email or web address], MD

## 2020-03-31 NOTE — Progress Notes (Signed)
Cardiology Office Note    Date:  04/01/2020   ID:  Dennis Bridges, DOB 1966-03-25, MRN [id number]  PCP:  Katherina Mires, MD  Cardiologist:  Shelva Majestic, MD   F/U cardiology/sleep evaluation  History of Present Illness:  Dennis Bridges is a 54 y.o. male who is a former patient of Dr. Rollene Fare.  I had seen him in October 2015 for evaluation.  He reestablish cardiology care with me in May 2021.  He presents for follow-up evaluation.  He works as a Stage manager, [employer] and [employer].  Remotely, he had had a negative work-up for pheochromocytoma.  In May 2014 an echo Doppler study showed an ejection fraction of 55 to 60% and he had normal diastolic parameters.  Left atrium was mildly dilated.  He had a normal perfusion study in April 2014 with post-rest ejection fraction at 63%.  He has a history of significant obesity and in the past had weighed as much as 314 pounds.  He subsequently lost approximately 100 pounds but over the past several years has regained weight and is now back up to 288 pounds.  He has been on CPAP therapy and has a new ResMed air sense 10 CPAP unit.  He works the night shift typically from 8:30 PM until 6:55 AM.  He typically goes to bed at 8 AM and oftentimes wakes up between 4 and 5 PM.  He uses CPAP.  His DME company is [employer].  Recently, he has noticed some blood pressure lability with blood pressures ranging from 120-150.  He also notes an occasional palpitation.  He is on Bystolic 5 mg daily and also hydralazine 37.5 mg twice a day for blood pressure he also is on fenofibrate for hyperlipidemia and fish oil.  When I saw him for reestablishment of care with me in May 2021 I was able to obtain a new download once we linked his machine to our office and a download from November 19, 2019 through Dec 18, 2019 showed 100% compliance with average usage at 7 hours and 45 minutes.  He is set at a minimum pressure for maximum pressure of 20 and AHI 0.3.   His 95th percentile pressure is 14.4 with a maximum average pressure at 16.5.    During his initial evaluation, I added HCTZ in light of his blood pressure lability and issues with mild intermittent leg swelling.  He was on Bystolic 5 mg and hydralazine 37.5 mg twice a day.  I recommended he undergo a follow-up echo Doppler study which was done on January 14, 2020.  EF was 60 to 65%.  There is elevated left ventricular end-diastolic pressure.  There was evidence for mild aortic sclerosis without stenosis or regurgitation.  He underwent laboratory which showed total cholesterol of 138 triglycerides 137 LDL cholesterol 73.  I obtained a new download in the office today from July 31 through March 30, 2020.  Compliance is excellent with 100% usage average use is at 7 hours and 50 minutes.  His auto unit is set at a minimum pressure of 8 with maximum up to 20 and his AHI is excellent at 0.2.  95th percentile average pressure was 15.7 with a maximum average pressure of 17.8.  He presents for evaluation   Past Medical History:  Diagnosis Date  . Hyperlipidemia   . Hypertension   . OSA (obstructive sleep apnea)     No past surgical history on file.  Current Medications: Outpatient Medications  Prior to Visit  Medication Sig Dispense Refill  . aspirin 81 MG tablet Take 81 mg by mouth daily.    . fenofibrate (TRICOR) 145 MG tablet Take 1 tablet (145 mg total) by mouth daily. 90 tablet 2  . hydrochlorothiazide (MICROZIDE) 12.5 MG capsule Take 1 capsule (12.5 mg total) by mouth daily. 90 capsule 3  . ibuprofen (ADVIL,MOTRIN) 800 MG tablet Take 1 tablet (800 mg total) by mouth every 8 (eight) hours as needed for mild pain. 30 tablet 0  . methocarbamol (ROBAXIN) 500 MG tablet Take 2 tablets (1,000 mg total) by mouth every 8 (eight) hours as needed for muscle spasms. 20 tablet 0  . nebivolol (BYSTOLIC) 5 MG tablet Take 5 mg by mouth daily.    . Omega-3 Fatty Acids (FISH OIL) 1200 MG CPDR Take 1,200 Units by  mouth 2 (two) times daily.    . predniSONE (DELTASONE) 10 MG tablet 6 tabs po day 1, 5 tabs po day 2, 4 tabs po day 3, 3 tabs po day 4, 2 tabs po day 5, 1 tab po day 6 21 tablet 0  . hydrALAZINE (APRESOLINE) 25 MG tablet Take 1 & 1/2 tablet twice a day 90 tablet 6   No facility-administered medications prior to visit.     Allergies:   Lisinopril   Social History   Socioeconomic History  . Marital status: Divorced    Spouse name: Not on file  . Number of children: Not on file  . Years of education: Not on file  . Highest education level: Not on file  Occupational History  . Not on file  Tobacco Use  . Smoking status: Former Smoker    Packs/day: 1.00    Years: 20.00    Pack years: 20.00    Types: Cigarettes    Quit date: 07/24/2010    Years since quitting: 9.6  . Smokeless tobacco: Never Used  Vaping Use  . Vaping Use: Never used  Substance and Sexual Activity  . Alcohol use: No    Alcohol/week: 0.0 standard drinks  . Drug use: No  . Sexual activity: Not on file  Other Topics Concern  . Not on file  Social History Narrative  . Not on file   Social Determinants of Health   Financial Resource Strain:   . Difficulty of Paying Living Expenses: Not on file  Food Insecurity:   . Worried About Charity fundraiser in the Last Year: Not on file  . Ran Out of Food in the Last Year: Not on file  Transportation Needs:   . Lack of Transportation (Medical): Not on file  . Lack of Transportation (Non-Medical): Not on file  Physical Activity:   . Days of Exercise per Week: Not on file  . Minutes of Exercise per Session: Not on file  Stress:   . Feeling of Stress : Not on file  Social Connections:   . Frequency of Communication with Friends and Family: Not on file  . Frequency of Social Gatherings with Friends and Family: Not on file  . Attends Religious Services: Not on file  . Active Member of Clubs or Organizations: Not on file  . Attends Archivist Meetings: Not  on file  . Marital Status: Not on file     Family History:  The patient's family history includes Cancer - Lung in his father; Cancer - Other in his father; Diabetes in his father; Hypertension in his father; Osteoporosis in his mother.   ROS  General: Negative; No fevers, chills, or night sweats;  HEENT: Negative; No changes in vision or hearing, sinus congestion, difficulty swallowing Pulmonary: Negative; No cough, wheezing, shortness of breath, hemoptysis Cardiovascular: Negative; No chest pain, presyncope, syncope, palpitations GI: Negative; No nausea, vomiting, diarrhea, or abdominal pain GU: Negative; No dysuria, hematuria, or difficulty voiding Musculoskeletal: Negative; no myalgias, joint pain, or weakness Hematologic/Oncology: Negative; no easy bruising, bleeding Endocrine: Negative; no heat/cold intolerance; no diabetes Neuro: Negative; no changes in balance, headaches Skin: Negative; No rashes or skin lesions Psychiatric: Negative; No behavioral problems, depression Sleep: Positive for OSA on CPAP therapy.  No awareness of breakthrough snoring,; daytime sleepiness, hypersomnolence, bruxism, restless legs, hypnogognic hallucinations, no cataplexy Other comprehensive 14 point system review is negative.   PHYSICAL EXAM:   VS:  BP (!) 146/82   Pulse 61   Ht 5' 10" (1.778 m)   Wt 299 lb (135.6 kg)   SpO2 98%   BMI 42.90 kg/m     Repeat blood pressure by me was 150/80  Wt Readings from Last 3 Encounters:  03/31/20 299 lb (135.6 kg)  12/19/19 288 lb (130.6 kg)  09/12/19 278 lb (126.1 kg)    General: Alert, oriented, no distress.  Skin: normal turgor, no rashes, warm and dry HEENT: Normocephalic, atraumatic. Pupils equal round and reactive to light; sclera anicteric; extraocular muscles intact;  Nose without nasal septal hypertrophy Mouth/Parynx benign; Mallinpatti scale 3 Neck: No JVD, no carotid bruits; normal carotid upstroke Lungs: clear to ausculatation and  percussion; no wheezing or rales Chest wall: without tenderness to palpitation Heart: PMI not displaced, RRR, s1 s2 normal, 2/6 systolic murmur, no diastolic murmur, no rubs, gallops, thrills, or heaves Abdomen: soft, nontender; no hepatosplenomehaly, BS+; abdominal aorta nontender and not dilated by palpation. Back: no CVA tenderness Pulses 2+ Musculoskeletal: full range of motion, normal strength, no joint deformities Extremities: no clubbing cyanosis or edema, Homan's sign negative  Neurologic: grossly nonfocal; Cranial nerves grossly wnl Psychologic: Normal mood and affect   Studies/Labs Reviewed:   EKG:  EKG is ordered today.  ECG (independently read by me): NSR at 61; no ectopy, normal intervals  May 2021 ECG (independently read by me): Normal sinus rhythm at 72 bpm.  Nonspecific ST changes.  Normal intervals.  No ectopy.  Recent Labs: BMP Latest Ref Rng & Units 12/20/2019 03/19/2013  Glucose 65 - 99 mg/dL 101(H) 93  BUN 6 - 24 mg/dL 18 17  Creatinine 0.76 - 1.27 mg/dL 0.98 1.19  BUN/Creat Ratio 9 - 20 18 -  Sodium 134 - 144 mmol/L 137 135  Potassium 3.5 - 5.2 mmol/L 3.8 4.3  Chloride 96 - 106 mmol/L 99 101  CO2 20 - 29 mmol/L 22 27  Calcium 8.7 - 10.2 mg/dL 9.6 10.4     Hepatic Function Latest Ref Rng & Units 12/20/2019 03/19/2013  Total Protein 6.0 - 8.5 g/dL 7.1 7.6  Albumin 3.8 - 4.9 g/dL 4.6 4.9  AST 0 - 40 IU/L 23 19  ALT 0 - 44 IU/L 35 30  Alk Phosphatase 48 - 121 IU/L 114 41  Total Bilirubin 0.0 - 1.2 mg/dL 0.5 0.6    CBC Latest Ref Rng & Units 12/20/2019 03/19/2013  WBC 3.4 - 10.8 x10E3/uL 9.2 9.0  Hemoglobin 13.0 - 17.7 g/dL 15.9 14.4  Hematocrit 37.5 - 51.0 % 48.7 43.4  Platelets 150 - 450 x10E3/uL 264 318   Lab Results  Component Value Date   MCV 85 12/20/2019   MCV 84.8 03/19/2013  Lab Results  Component Value Date   TSH 0.827 12/20/2019   No results found for: HGBA1C   BNP No results found for: BNP  ProBNP No results found for:  PROBNP   Lipid Panel     Component Value Date/Time   CHOL 138 12/20/2019 0811   TRIG 137 12/20/2019 0811   HDL 41 12/20/2019 0811   CHOLHDL 3.4 12/20/2019 0811   LDLCALC 73 12/20/2019 0811   LABVLDL 24 12/20/2019 0811     RADIOLOGY: No results found.   Additional studies/ records that were reviewed today include:  I reviewed the patient's prior evaluation in 2015 including his prior nuclear perfusion study from April 2014 evaluations from Dr. Horald Pollen, and previous renal duplex study from October 2015.  A download was obtained from November 19, 2019 through Dec 18, 2019  ASSESSMENT:    1. Essential hypertension   2. Obstructive sleep apnea on CPAP   3. Morbid obesity ([place])   4. Moderate mixed hyperlipidemia not requiring statin therapy    PLAN:  Dennis Bridges is a 54 year old gentleman who has a history of hypertension, hyperlipidemia, morbid obesity, and in 2014 echo Doppler evaluation showed normal ejection fraction with EF 55 to 60% with normal diastolic parameters and mild left atrial dilatation.  A nuclear perfusion study revealed normal myocardial perfusion and function.  He has a history of obstructive sleep apnea and has been using CPAP regularly.  He had obtained a new ResMed air sense 10 CPAP auto unit.  His current settings are set at a minimum pressure of 8 and maximum of 20 cm.  He can continues to have excellent compliance with 100% use and average usage of 7 hours and 50 minutes per night.  AHI is excellent at 0.2/h.  95th percentile pressure is 15.7 cm of water.  His blood pressure today continues to be somewhat elevated despite taking nebivolol 5 mg daily, hydralazine 37.5 mg twice a day and hydrochlorothiazide 12.5 mg daily.  I have recommended further titration of hydralazine to 50 mg twice a day.  Previous mild edema had improved with low-dose HCTZ.  I discussed with him target blood pressure is less than 130/80.  BMI is consistent with morbid obesity at 42.9.    Size were recommended.  He has not been as active as he had in the past and he had hurt his shoulder and had been out of work for some time.  Recent laboratory was reviewed.  He is on fenofibrate for hyperlipidemia.  Most recent triglycerides of 137, and LDL 73 with total cholesterol 138.  He will monitor his blood pressure at home.  Weight loss and exercise were recommended.  I will see him in 6 months for reevaluation or sooner as needed.   Medication Adjustments/Labs and Tests Ordered: Current medicines are reviewed at length with the patient today.  Concerns regarding medicines are outlined above.  Medication changes, Labs and Tests ordered today are listed in the Patient Instructions below. Patient Instructions  Medication Instructions:  INCREASE YOUR HYDRALAZINE TO 50MG TWO TIMES DAILY *If you need a refill on your cardiac medications before your next appointment, please call your pharmacy*     Follow-Up: At [facility], you and your health needs are our priority.  As part of our continuing mission to provide you with exceptional heart care, we have created designated Provider Care Teams.  These Care Teams include your primary Cardiologist (physician) and Advanced Practice Providers (APPs -  Physician Assistants and Nurse Practitioners)  who all work together to provide you with the care you need, when you need it.  We recommend signing up for the patient portal called "MyChart".  Sign up information is provided on this After Visit Summary.  MyChart is used to connect with patients for Virtual Visits (Telemedicine).  Patients are able to view lab/test results, encounter notes, upcoming appointments, etc.  Non-urgent messages can be sent to your provider as well.   To learn more about what you can do with MyChart, go to [email or web address].    Your next appointment:   6 month(s)  The format for your next appointment:   In Person  Provider:   Shelva Majestic, MD       Signed, Shelva Majestic, MD  04/01/2020 6:05 PM    [facility] [address] [place], [place], [place]  [address] Phone: [phone]

## 2020-04-01 ENCOUNTER — Encounter: Payer: Self-pay | Admitting: Cardiovascular Disease

## 2020-04-29 ENCOUNTER — Telehealth: Payer: Self-pay | Admitting: Cardiovascular Disease

## 2020-04-29 NOTE — Telephone Encounter (Signed)
Patient is requesting a 30-day report of CPAP machine readings for a physical. He states the 30-day report can be emailed to him at [email or web address].

## 2020-05-01 NOTE — Telephone Encounter (Signed)
   Pt is calling back to follow up request. He said he would like to know if he can pick it up tomorrow, he needs the 30 day cpap report because he have a physical on Sunday. Staff message sent to wanda as well

## 2020-05-01 NOTE — Telephone Encounter (Signed)
Patient is requesting a 30-day report of CPAP machine readings for a physical. He states the 30-day report can be emailed to him at [email or web address]

## 2020-05-02 NOTE — Telephone Encounter (Signed)
Returned a call to patient informing him I am just now being able to get his sleep report. I will call him back shortly and give him verbal information to relay to whoever he needs to give it to and it may be that we can fax it on Monday.

## 2020-05-02 NOTE — Telephone Encounter (Signed)
Left CPAP compliance details on VM. Also left message that I will attempt to call him again on tomorrow to see if he can provide me with a fax number to send the report to. ( plan to work a few hours tomorrow- Saturday)

## 2020-05-03 ENCOUNTER — Telehealth: Payer: Self-pay | Admitting: *Deleted

## 2020-05-03 NOTE — Telephone Encounter (Signed)
Left message I was reaching out again to get the name where I can fax his CPAP download. Will call again on Monday.

## 2020-05-03 NOTE — Telephone Encounter (Signed)
Found a [employer] in patient chart. Called and confirmed patient has appointment tomorrow for DOT exam. 30 day CPAP download faxed to [phone]. Receptionist states patient seen at a different location, however she will scan it into his chart and the examining provider will be able to see it.

## 2020-05-05 NOTE — Telephone Encounter (Signed)
Patient called back returning Dennis Bridges's Call.

## 2020-05-06 ENCOUNTER — Telehealth: Payer: Self-pay | Admitting: Cardiovascular Disease

## 2020-05-06 NOTE — Telephone Encounter (Signed)
New Message:     Pt said he would like to come by tomorrow and pick up a copy of his 30 days report from his C Pap machine please.

## 2020-06-09 NOTE — Telephone Encounter (Signed)
Reached out to [facility] and she will print his report and put at the front desk at [employer].

## 2020-09-16 ENCOUNTER — Telehealth: Payer: Self-pay | Admitting: Cardiovascular Disease

## 2020-09-16 NOTE — Telephone Encounter (Signed)
Patient called and stated that [facility] was going to send Dr. Tresa Endo a medical clearance form on 09/16/20. Patient has rotater cuff surgery on 10/06/20.

## 2020-09-16 NOTE — Telephone Encounter (Signed)
Spoke with patient, advised patient that someone will reach out to him once our pre op pool has went over pre clearance form. Patient verbalized understanding.

## 2020-09-17 ENCOUNTER — Telehealth: Payer: Self-pay

## 2020-09-17 NOTE — Telephone Encounter (Signed)
   [facility] Pre-operative Risk Assessment    HEARTCARE STAFF: - Please ensure there is not already an duplicate clearance open for this procedure. - Under Visit Info/Reason for Call, type in Other and utilize the format Clearance MM/DD/YY or Clearance TBD. Do not use dashes or single digits. - If request is for dental extraction, please clarify the # of teeth to be extracted.  Request for surgical clearance:  1. What type of surgery is being performed? Right shoulder scope, biceps tenodesis, SAD, DCR, possible RCR   2. Dennis Bridges is this surgery scheduled? 03   3. What type of clearance is required (medical clearance vs. Pharmacy clearance to hold med vs. Both)? BOTH  4. Are there any medications that need to be held prior to surgery and how long? ASA   5. Practice name and name of physician performing surgery? [facility], Dr. Victorino December   6. What is the office phone number? [phone]   7.   What is the office fax number? [phone]  8.   Anesthesia type (None, local, MAC, general) ? Choice   Dennis Bridges 09/17/2020, 2:30 PM  _________________________________________________________________   (provider comments below)

## 2020-09-19 NOTE — Telephone Encounter (Signed)
   Primary Cardiologist: Nicki Guadalajara, MD  Chart reviewed as part of pre-operative protocol coverage. Patient was contacted 09/19/2020 in reference to pre-operative risk assessment for pending surgery as outlined below.  Dennis Bridges was last seen on 03/31/20 by Dr. Tresa Endo. At the time he was doing well from a cardaic perspective, Hydralazine increased to 50mg  BID for improved BP control, and recommended to follow up in 6 months.  Since that day, Dennis Bridges has done well. Denies chest pain, pressure, tightness. No shortness of breath nor dyspnea on exertion.   He does report an occasional palpitation when laying down for sleep that self resolves after a few seconds and is not associated with orthopnea, PND, pain, nor shortness of breath. Likely etiology PVC. Reassurance provided and shares with me that they are overall not bothersome, no indication for further evaluation at this time as echo 01/2020 with normal LVEF, no RWMA, mild aortic sclerosis without stenosis.   Therefore, based on ACC/AHA guidelines, the patient would be at acceptable risk for the planned procedure without further cardiovascular testing.   As he has no history of CAD, may hold Aspirin 7 days prior to planned procedure.   The patient was advised that if he develops new symptoms prior to surgery to contact our office to arrange for a follow-up visit, and he verbalized understanding.  I will route this recommendation to the requesting party via Epic fax function and remove from pre-op pool. Please call with questions.  01/2020, NP 09/19/2020, 1:35 PM

## 2020-10-15 ENCOUNTER — Encounter: Payer: Self-pay | Admitting: Cardiovascular Disease

## 2020-10-15 ENCOUNTER — Other Ambulatory Visit: Payer: Self-pay

## 2020-10-15 ENCOUNTER — Ambulatory Visit: Payer: BC Managed Care – PPO | Admitting: Cardiovascular Disease

## 2020-10-15 VITALS — BP 140/60 | HR 83 | Ht 71.0 in | Wt 325.6 lb

## 2020-10-15 DIAGNOSIS — G4733 Obstructive sleep apnea (adult) (pediatric): Secondary | ICD-10-CM | POA: Diagnosis not present

## 2020-10-15 DIAGNOSIS — M25473 Effusion, unspecified ankle: Secondary | ICD-10-CM

## 2020-10-15 DIAGNOSIS — I10 Essential (primary) hypertension: Secondary | ICD-10-CM

## 2020-10-15 DIAGNOSIS — E782 Mixed hyperlipidemia: Secondary | ICD-10-CM

## 2020-10-15 DIAGNOSIS — Z9989 Dependence on other enabling machines and devices: Secondary | ICD-10-CM

## 2020-10-15 MED ORDER — HYDROCHLOROTHIAZIDE 25 MG PO TABS: 25.0000 mg | ORAL_TABLET | Freq: Every day | ORAL | 3 refills | Status: AC

## 2020-10-15 NOTE — Progress Notes (Signed)
Cardiology Office Note    Date:  10/20/2020   ID:  Dennis Bridges, DOB 07/15/1966, MRN [id number]  PCP:  Katherina Mires, MD  Cardiologist:  Shelva Majestic, MD   7 month F/U cardiology/sleep evaluation  History of Present Illness:  Dennis Bridges is a 55 y.o. male who is a former patient of Dr. Rollene Fare.  I had seen him in October 2015 for evaluation.  He reestablished cardiology care with me in May 2021.  I last saw him in August 2021.  He presents for follow-up evaluation.  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  Remotely, he had had a negative work-up for pheochromocytoma.  In May 2014 an echo Doppler study showed an ejection fraction of 55 to 60% and he had normal diastolic parameters.  Left atrium was mildly dilated.  He had a normal perfusion study in April 2014 with post-rest ejection fraction at 63%.  He has a history of significant obesity and in the past had weighed as much as 314 pounds.  He subsequently lost approximately 100 pounds but over the past several years has regained weight and is now back up to 288 pounds.  He has been on CPAP therapy and has a new ResMed air sense 10 CPAP unit.  He works the night shift typically from 8:30 PM until 6:55 AM.  He typically goes to bed at 8 AM and oftentimes wakes up between 4 and 5 PM.  He uses CPAP.  His DME company is [employer].  Recently, he has noticed some blood pressure lability with blood pressures ranging from 120-150.  He also notes an occasional palpitation.  He is on Bystolic 5 mg daily and also hydralazine 37.5 mg twice a day for blood pressure he also is on fenofibrate for hyperlipidemia and fish oil.  When I saw him for reestablishment of care with me in May 2021 I was able to obtain a new download once we linked his machine to our office and a download from November 19, 2019 through Dec 18, 2019 showed 100% compliance with average usage at 7 hours and 45 minutes.  He is set at a minimum  pressure for maximum pressure of 20 and AHI 0.3.  His 95th percentile pressure is 14.4 with a maximum average pressure at 16.5.    During his initial evaluation, I added HCTZ in light of his blood pressure lability and issues with mild intermittent leg swelling.  He was on Bystolic 5 mg and hydralazine 37.5 mg twice a day.  I recommended he undergo a follow-up echo Doppler study which was done on January 14, 2020.  EF was 60 to 65%.  There was elevated left ventricular end-diastolic pressure.  There was evidence for mild aortic sclerosis without stenosis or regurgitation.  He underwent laboratory which showed total cholesterol of 138 triglycerides 137 LDL cholesterol 73.   I last saw him in August 2021.  I obtained a new download in the office today from July 31 through March 30, 2020.  Compliance is excellent with 100% usage average use is at 7 hours and 50 minutes.  His auto unit is set at a minimum pressure of 8 with maximum up to 20 and his AHI is excellent at 0.2.  95th percentile average pressure was 15.7 with a maximum average pressure of 17.8.    Since I last saw him in August 2021, he suffered a right shoulder labrum tear and underwent surgery by  Dr. Stann Mainland.  He has noticed some mild shortness of breath.  He has noticed some mild leg swelling and has been walking 5 days/week.  He denies any chest pain.  He denies palpitations.  A new download was obtained in the office today from February 14 through October 14, 2020 which continues to show excellent CPAP compliance with average 8 hours and 16 minutes of CPAP use per night.  His CPAP auto is set at a pressure range of 8 to 20 cm and his 95th percentile pressure is 18 with a maximum average pressure of 19.4.  AHI is excellent at 0.1.  He presents for reevaluation.  Past Medical History:  Diagnosis Date  . Hyperlipidemia   . Hypertension   . OSA (obstructive sleep apnea)     No past surgical history on file.  Current Medications: Outpatient  Medications Prior to Visit  Medication Sig Dispense Refill  . aspirin 81 MG tablet Take 81 mg by mouth daily.    . fenofibrate (TRICOR) 145 MG tablet Take 1 tablet (145 mg total) by mouth daily. 90 tablet 2  . hydrALAZINE (APRESOLINE) 50 MG tablet Take 1 tablet (50 mg total) by mouth 2 (two) times daily. 180 tablet 3  . ibuprofen (ADVIL,MOTRIN) 800 MG tablet Take 1 tablet (800 mg total) by mouth every 8 (eight) hours as needed for mild pain. 30 tablet 0  . methocarbamol (ROBAXIN) 500 MG tablet Take 2 tablets (1,000 mg total) by mouth every 8 (eight) hours as needed for muscle spasms. 20 tablet 0  . nebivolol (BYSTOLIC) 5 MG tablet Take 5 mg by mouth daily.    . Omega-3 Fatty Acids (FISH OIL) 1200 MG CPDR Take 1,200 Units by mouth 2 (two) times daily.    Marland Kitchen oxyCODONE (OXY IR/ROXICODONE) 5 MG immediate release tablet oxycodone 5 mg tablet  Take 1 tablet every 6 hours by oral route as needed.    . predniSONE (DELTASONE) 10 MG tablet 6 tabs po day 1, 5 tabs po day 2, 4 tabs po day 3, 3 tabs po day 4, 2 tabs po day 5, 1 tab po day 6 21 tablet 0  . hydrochlorothiazide (MICROZIDE) 12.5 MG capsule Take 1 capsule (12.5 mg total) by mouth daily. 90 capsule 3   No facility-administered medications prior to visit.     Allergies:   Lisinopril   Social History   Socioeconomic History  . Marital status: Divorced    Spouse name: Not on file  . Number of children: Not on file  . Years of education: Not on file  . Highest education level: Not on file  Occupational History  . Not on file  Tobacco Use  . Smoking status: Former Smoker    Packs/day: 1.00    Years: 20.00    Pack years: 20.00    Types: Cigarettes    Quit date: 07/24/2010    Years since quitting: 10.2  . Smokeless tobacco: Never Used  Vaping Use  . Vaping Use: Never used  Substance and Sexual Activity  . Alcohol use: No    Alcohol/week: 0.0 standard drinks  . Drug use: No  . Sexual activity: Not on file  Other Topics Concern  .  Not on file  Social History Narrative  . Not on file   Social Determinants of Health   Financial Resource Strain: Not on file  Food Insecurity: Not on file  Transportation Needs: Not on file  Physical Activity: Not on file  Stress: Not on file  Social Connections: Not on file     Family History:  The patient's family history includes Cancer - Lung in his father; Cancer - Other in his father; Diabetes in his father; Hypertension in his father; Osteoporosis in his mother.   ROS General: Negative; No fevers, chills, or night sweats;  HEENT: Negative; No changes in vision or hearing, sinus congestion, difficulty swallowing Pulmonary: Negative; No cough, wheezing, shortness of breath, hemoptysis Cardiovascular: Negative; No chest pain, presyncope, syncope, palpitations GI: Negative; No nausea, vomiting, diarrhea, or abdominal pain GU: Negative; No dysuria, hematuria, or difficulty voiding Musculoskeletal: Recent right shoulder surgery secondary to labrum tear Hematologic/Oncology: Negative; no easy bruising, bleeding Endocrine: Negative; no heat/cold intolerance; no diabetes Neuro: Negative; no changes in balance, headaches Skin: Negative; No rashes or skin lesions Psychiatric: Negative; No behavioral problems, depression Sleep: Positive for OSA on CPAP therapy.  No awareness of breakthrough snoring,; daytime sleepiness, hypersomnolence, bruxism, restless legs, hypnogognic hallucinations, no cataplexy Other comprehensive 14 point system review is negative.   PHYSICAL EXAM:   VS:  BP 140/60   Pulse 83   Ht '5\' 11"'  (1.803 m)   Wt (!) 325 lb 9.6 oz (147.7 kg)   BMI 45.41 kg/m     Repeat blood pressure by me 128/70  Wt Readings from Last 3 Encounters:  10/15/20 (!) 325 lb 9.6 oz (147.7 kg)  03/31/20 299 lb (135.6 kg)  12/19/19 288 lb (130.6 kg)    General: Alert, oriented, no distress.  Skin: normal turgor, no rashes, warm and dry HEENT: Normocephalic, atraumatic. Pupils equal  round and reactive to light; sclera anicteric; extraocular muscles intact;  Nose without nasal septal hypertrophy Mouth/Parynx benign; Mallinpatti scale 3 Neck: No JVD, no carotid bruits; normal carotid upstroke Lungs: clear to ausculatation and percussion; no wheezing or rales Chest wall: without tenderness to palpitation Heart: PMI not displaced, RRR, s1 s2 normal, 1/6 systolic murmur, no diastolic murmur, no rubs, gallops, thrills, or heaves Abdomen: soft, nontender; no hepatosplenomehaly, BS+; abdominal aorta nontender and not dilated by palpation. Back: no CVA tenderness Pulses 2+ Musculoskeletal: full range of motion, normal strength, no joint deformities Extremities: Trace ankle edema no clubbing cyanosis, Homan's sign negative  Neurologic: grossly nonfocal; Cranial nerves grossly wnl Psychologic: Normal mood and affect   Studies/Labs Reviewed:   EKG:  EKG is ordered today. ECG (independently read by me): NSR at 83; nonspecific T changes  August 2021  ECG (independently read by me): NSR at 61; no ectopy, normal intervals  May 2021 ECG (independently read by me): Normal sinus rhythm at 72 bpm.  Nonspecific ST changes.  Normal intervals.  No ectopy.  Recent Labs: BMP Latest Ref Rng & Units 12/20/2019 03/19/2013  Glucose 65 - 99 mg/dL 101(H) 93  BUN 6 - 24 mg/dL 18 17  Creatinine 0.76 - 1.27 mg/dL 0.98 1.19  BUN/Creat Ratio 9 - 20 18 -  Sodium 134 - 144 mmol/L 137 135  Potassium 3.5 - 5.2 mmol/L 3.8 4.3  Chloride 96 - 106 mmol/L 99 101  CO2 20 - 29 mmol/L 22 27  Calcium 8.7 - 10.2 mg/dL 9.6 10.4     Hepatic Function Latest Ref Rng & Units 12/20/2019 03/19/2013  Total Protein 6.0 - 8.5 g/dL 7.1 7.6  Albumin 3.8 - 4.9 g/dL 4.6 4.9  AST 0 - 40 IU/L 23 19  ALT 0 - 44 IU/L 35 30  Alk Phosphatase 48 - 121 IU/L 114 41  Total Bilirubin 0.0 - 1.2 mg/dL 0.5 0.6  CBC Latest Ref Rng & Units 12/20/2019 03/19/2013  WBC 3.4 - 10.8 x10E3/uL 9.2 9.0  Hemoglobin 13.0 - 17.7 g/dL 15.9  14.4  Hematocrit 37.5 - 51.0 % 48.7 43.4  Platelets 150 - 450 x10E3/uL 264 318   Lab Results  Component Value Date   MCV 85 12/20/2019   MCV 84.8 03/19/2013   Lab Results  Component Value Date   TSH 0.827 12/20/2019   No results found for: HGBA1C   BNP No results found for: BNP  ProBNP No results found for: PROBNP   Lipid Panel     Component Value Date/Time   CHOL 138 12/20/2019 0811   TRIG 137 12/20/2019 0811   HDL 41 12/20/2019 0811   CHOLHDL 3.4 12/20/2019 0811   LDLCALC 73 12/20/2019 0811   LABVLDL 24 12/20/2019 0811     RADIOLOGY: No results found.   Additional studies/ records that were reviewed today include:  I reviewed the patient's prior evaluation in 2015 including his prior nuclear perfusion study from April 2014 evaluations from Dr. Horald Pollen, and previous renal duplex study from October 2015.  A download was obtained from November 19, 2019 through Dec 18, 2019  ASSESSMENT:    1. Essential hypertension   2. Moderate mixed hyperlipidemia not requiring statin therapy   3. Obstructive sleep apnea on CPAP   4. Morbid obesity ([place])   5. Ankle swelling, unspecified laterality    PLAN:  Dennis Bridges is a 55 year old gentleman who has a history of hypertension, hyperlipidemia, morbid obesity, and in 2014 echo Doppler evaluation showed normal ejection fraction with EF 55 to 60% with normal diastolic parameters and mild left atrial dilatation.  A nuclear perfusion study revealed normal myocardial perfusion and function.  He has a history of obstructive sleep apnea and has been using CPAP regularly.  He obtained a new ResMed air sense 10 CPAP auto unit.  His current settings are set at a minimum pressure of 8 and maximum of 20 cm.  At his last evaluation in August 2021 he continued to have excellent compliance with 100% use and average use of 7 hours and 50 minutes.  AHI was excellent at 0.2.  His most recent download is similar.  Average use is 8 hours and 16  minutes and his AHI is 0.1.  He does not sleep without it and continues to note marked benefit.  He tolerated right shoulder surgery resulting from a labrum tear by Dr. Stann Mainland.  He continues to be morbidly obese and he has been trying to lose some weight.  He states 3 months ago his weight was up to 335 pounds and he has lost 10 pounds since that time.  He is now trying to walk 5 days/week.  His blood pressure was slightly increased at 140/60 upon presentation but on repeat by me was normal.  With his mild leg swelling I have suggested slight titration of hydrochlorothiazide to 25 mg daily.  He will continue his hydralazine 50 mg twice a day as well as nebivolol 5 mg daily.  He is on omega-3 fatty acid in addition to fenofibrate for his mixed hyperlipidemia.  He sees Dr. Suzanna Obey for primary care.  As long as he remains stable I will see him in 1 year for reevaluation.    Medication Adjustments/Labs and Tests Ordered: Current medicines are reviewed at length with the patient today.  Concerns regarding medicines are outlined above.  Medication changes, Labs and Tests ordered today are listed in the Patient  Instructions below. Patient Instructions  Medication Instructions:  INCREASE- Hydrochlorothiazide 25 mg by mouth daily  *If you need a refill on your cardiac medications before your next appointment, please call your pharmacy*   Lab Work: None Ordered   Testing/Procedures: None Ordered   Follow-Up: At [facility], you and your health needs are our priority.  As part of our continuing mission to provide you with exceptional heart care, we have created designated Provider Care Teams.  These Care Teams include your primary Cardiologist (physician) and Advanced Practice Providers (APPs -  Physician Assistants and Nurse Practitioners) who all work together to provide you with the care you need, when you need it.  We recommend signing up for the patient portal called "MyChart".  Sign up  information is provided on this After Visit Summary.  MyChart is used to connect with patients for Virtual Visits (Telemedicine).  Patients are able to view lab/test results, encounter notes, upcoming appointments, etc.  Non-urgent messages can be sent to your provider as well.   To learn more about what you can do with MyChart, go to [email or web address].    Your next appointment:   1 year(s)  The format for your next appointment:   In Person  Provider:   You may see Shelva Majestic, MD or one of the following Advanced Practice Providers on your designated Care Team:    Almyra Deforest, PA-C  Fabian Sharp, PA-C or   Roby Lofts, [place]        Signed, Shelva Majestic, MD  10/20/2020 4:27 PM    [facility] [address] [place], [place], [place]  [id number] Phone: [phone]

## 2020-10-15 NOTE — Patient Instructions (Signed)
Medication Instructions:  INCREASE- Hydrochlorothiazide 25 mg by mouth daily  *If you need a refill on your cardiac medications before your next appointment, please call your pharmacy*   Lab Work: None Ordered   Testing/Procedures: None Ordered   Follow-Up: At [facility], you and your health needs are our priority.  As part of our continuing mission to provide you with exceptional heart care, we have created designated Provider Care Teams.  These Care Teams include your primary Cardiologist (physician) and Advanced Practice Providers (APPs -  Physician Assistants and Nurse Practitioners) who all work together to provide you with the care you need, when you need it.  We recommend signing up for the patient portal called "MyChart".  Sign up information is provided on this After Visit Summary.  MyChart is used to connect with patients for Virtual Visits (Telemedicine).  Patients are able to view lab/test results, encounter notes, upcoming appointments, etc.  Non-urgent messages can be sent to your provider as well.   To learn more about what you can do with MyChart, go to [email or web address].    Your next appointment:   1 year(s)  The format for your next appointment:   In Person  Provider:   You may see Nicki Guadalajara, MD or one of the following Advanced Practice Providers on your designated Care Team:    Azalee Course, PA-C  Micah Flesher, PA-C or   Judy Pimple, [place]

## 2020-10-20 ENCOUNTER — Encounter: Payer: Self-pay | Admitting: Cardiovascular Disease

## 2020-11-11 IMAGING — DX DG KNEE COMPLETE 4+V*R*
4 series · 4 of 4 positions shown · non-contrast
Comparison: None.

CLINICAL DATA: Right knee pain.  Knee buckled.

EXAM:
RIGHT KNEE - COMPLETE 4+ VIEW

[knee ap]
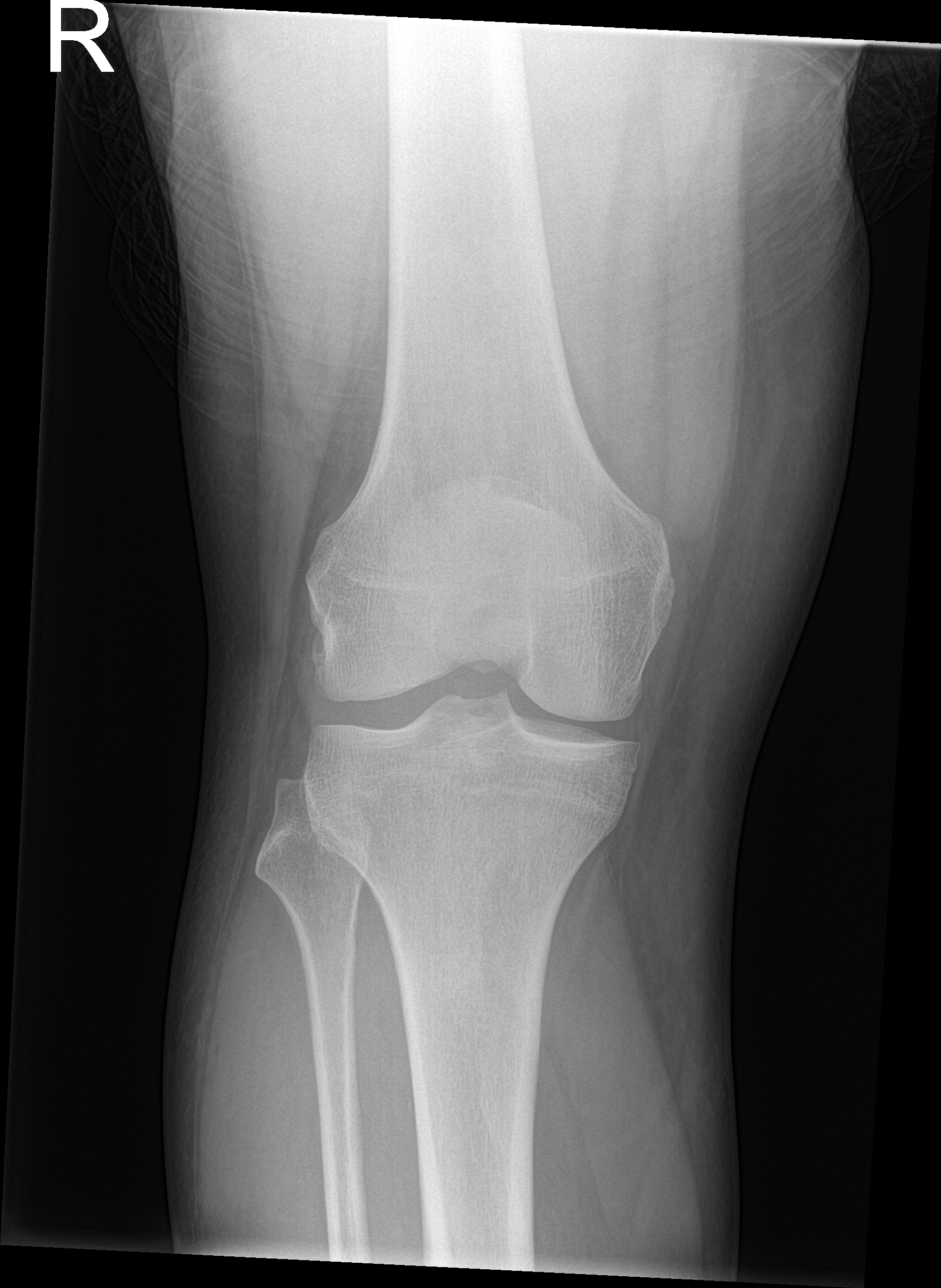

[knee tunnel]
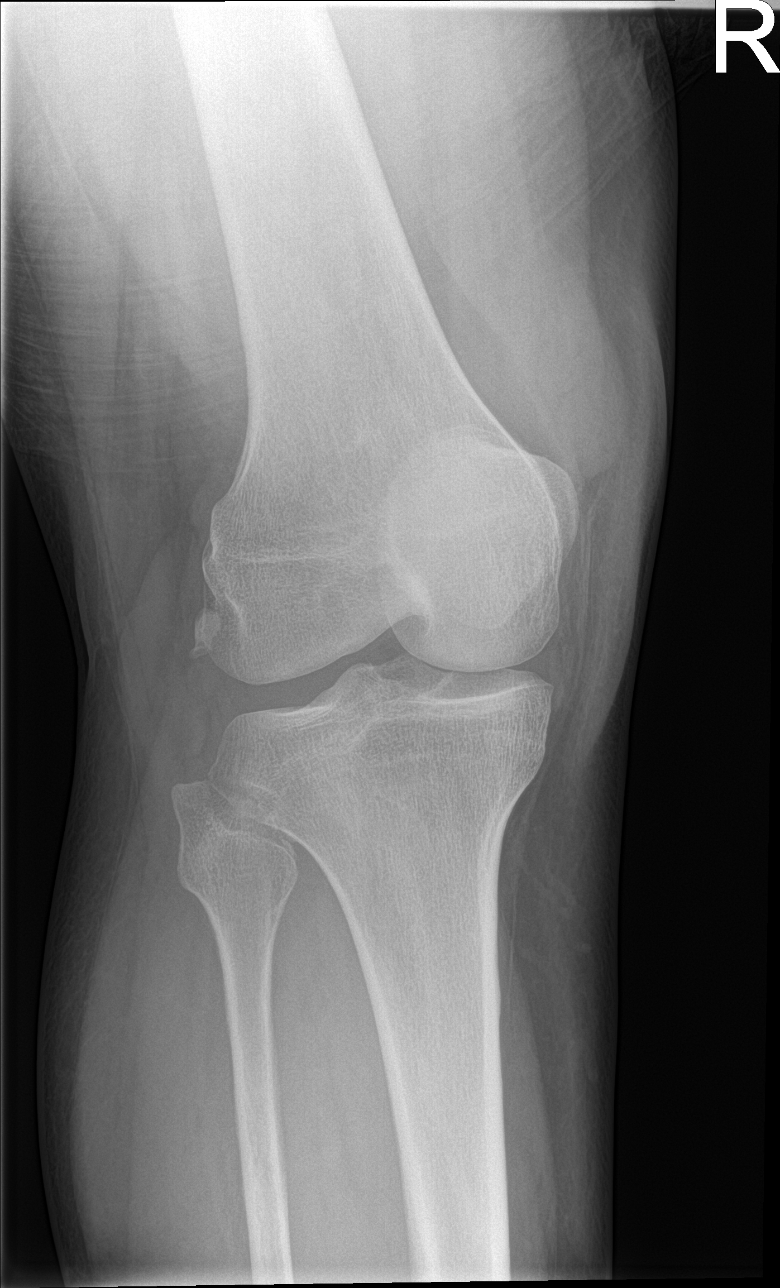

[patella skyline]
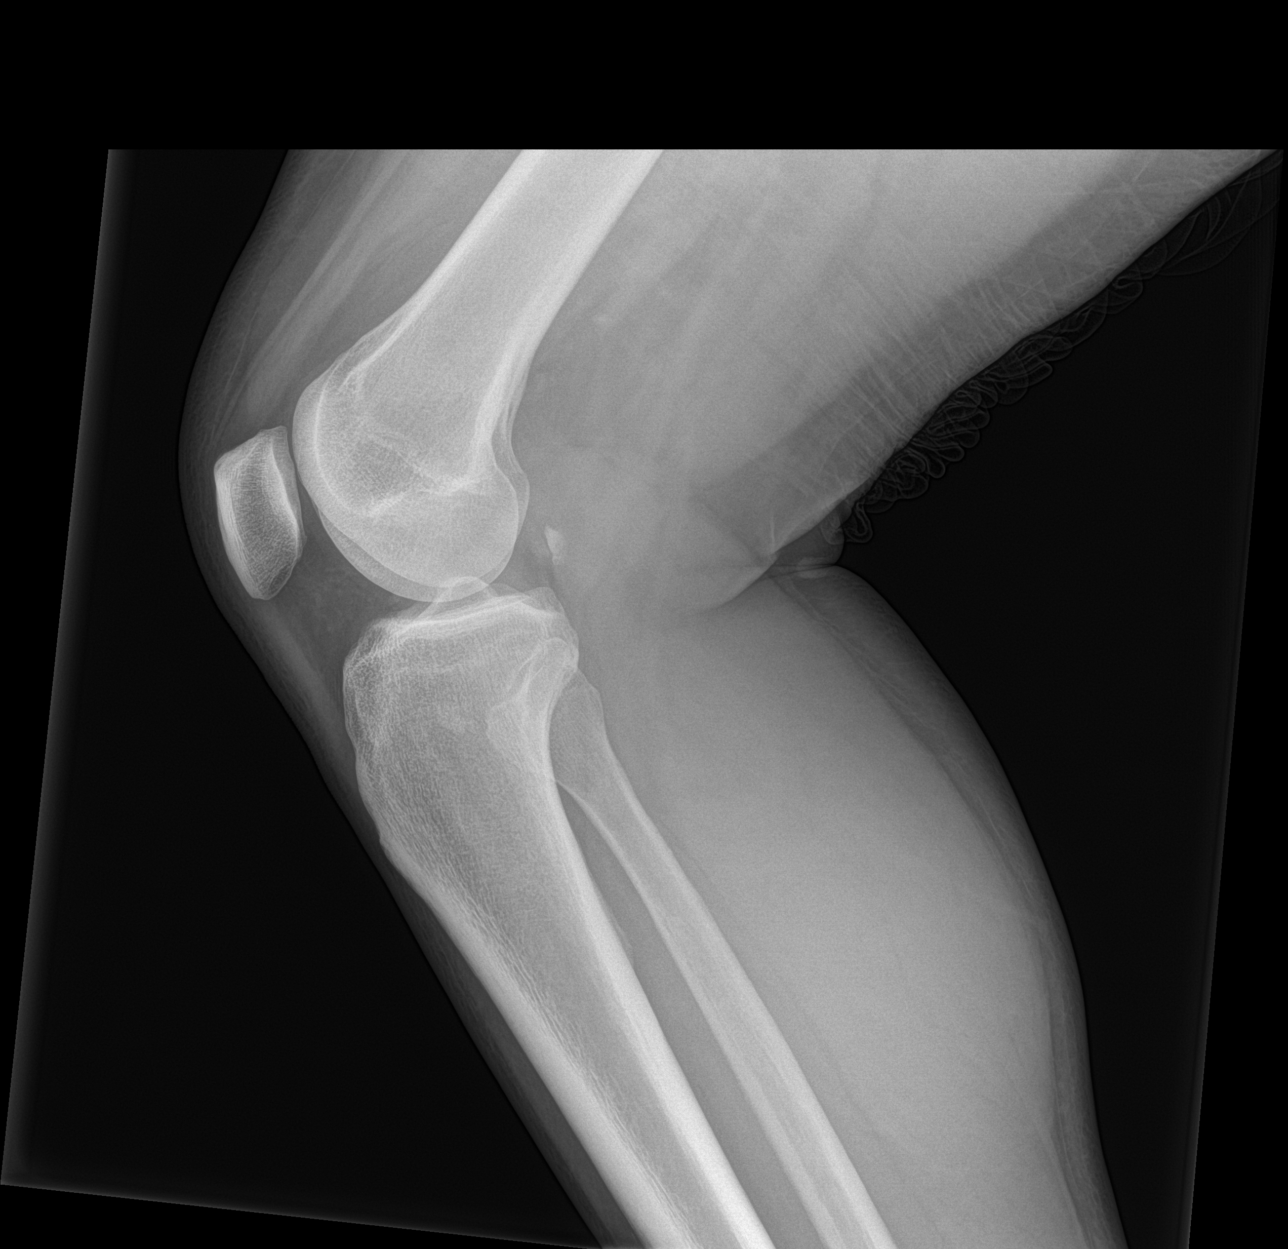

[knee obl]
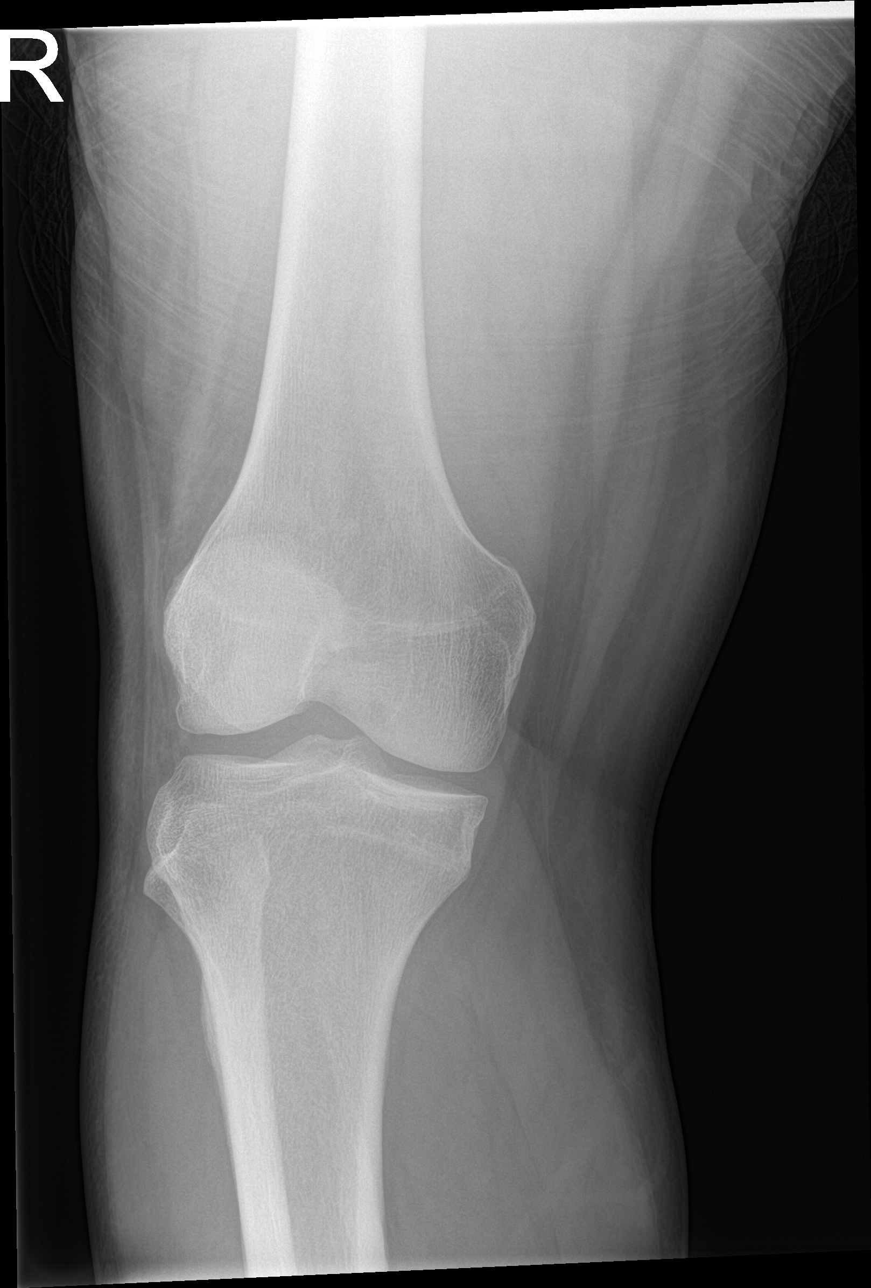

[4 of 4 positions shown; findings below may reference images not displayed]

FINDINGS: No evidence of fracture, dislocation, or joint effusion. No evidence
of arthropathy or other focal bone abnormality. Soft tissues are
unremarkable.
IMPRESSION: Negative radiographs of the right knee.

## 2021-01-13 ENCOUNTER — Other Ambulatory Visit: Payer: Self-pay | Admitting: Cardiovascular Disease

## 2021-05-13 ENCOUNTER — Telehealth: Payer: Self-pay

## 2021-05-13 NOTE — Telephone Encounter (Signed)
   [facility] Pre-operative Risk Assessment    Patient Name: Dennis Bridges  DOB: 1966/06/23 MRN: [id number]  Request for surgical clearance:   What type of surgery is being performed? Right shoulder MUA   When is this surgery scheduled? 06-01-2021    What type of clearance is required (medical clearance vs. Pharmacy clearance to hold med vs. Both)? BOTH   Are there any medications that need to be held prior to surgery and how long? ASA    Practice name and name of physician performing surgery? [facility], Dr. Victorino December ATTN: KERRI MAZE   What is the office phone number? [phone]   7.   What is the office fax number? [phone]   8.   Anesthesia type (None, local, MAC, general) ? Choice-???

## 2021-05-13 NOTE — Telephone Encounter (Signed)
   Name: Dennis Bridges  DOB: May 16, 1966  MRN: [id number]   Primary Cardiologist: Nicki Guadalajara, MD  Chart reviewed as part of pre-operative protocol coverage. Patient was contacted 05/13/2021 in reference to pre-operative risk assessment for pending surgery as outlined below.  Dennis Bridges was last seen on 10/15/2020 by Dr. Tresa Endo.  Since that day, Dennis Bridges has done well without any chest pain or worsening dyspnea.  Therefore, based on ACC/AHA guidelines, the patient would be at acceptable risk for the planned procedure without further cardiovascular testing.   Patient may hold aspirin for 5 days prior to the surgery and restart it as soon as possible afterward at the surgeon's discretion.  The patient was advised that if he develops new symptoms prior to surgery to contact our office to arrange for a follow-up visit, and he verbalized understanding.  I will route this recommendation to the requesting party via Epic fax function and remove from pre-op pool. Please call with questions.  [place], [place] 05/13/2021, 6:53 PM

## 2021-12-07 ENCOUNTER — Telehealth: Payer: Self-pay | Admitting: Cardiovascular Disease

## 2021-12-07 NOTE — Telephone Encounter (Signed)
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 He would like it sent to email: [email or web address] ?

## 2021-12-08 NOTE — Telephone Encounter (Signed)
Patient notified 51 download has been faxed to the email address provided. Patient checked while I was on the phone to confirm receipt of the email. It was received. ?

## 2023-08-24 ENCOUNTER — Telehealth: Payer: Self-pay | Admitting: Cardiovascular Disease

## 2023-08-24 NOTE — Telephone Encounter (Signed)
  Maddie from [facility] stated that the pt has moved there and is transferring their care for CPAP. The pt also needs new supplies. They are requesting a copy of the pt's sleep study results to provide the necessary supplies  Phone: [phone] Fax: [phone]
